# Patient Record
Sex: Male | Born: 1935 | Race: White | Hispanic: No | Marital: Married | Smoking: Former smoker
Health system: Southern US, Community
[De-identification: ages and names within clinical notes are randomized; demographics above are authoritative.]

## PROBLEM LIST (undated history)

## (undated) DIAGNOSIS — F0280 Dementia in other diseases classified elsewhere without behavioral disturbance: Secondary | ICD-10-CM

## (undated) DIAGNOSIS — I2510 Atherosclerotic heart disease of native coronary artery without angina pectoris: Secondary | ICD-10-CM

## (undated) DIAGNOSIS — F32A Depression, unspecified: Secondary | ICD-10-CM

## (undated) DIAGNOSIS — K6389 Other specified diseases of intestine: Secondary | ICD-10-CM

## (undated) DIAGNOSIS — I10 Essential (primary) hypertension: Secondary | ICD-10-CM

## (undated) DIAGNOSIS — N189 Chronic kidney disease, unspecified: Secondary | ICD-10-CM

## (undated) DIAGNOSIS — J209 Acute bronchitis, unspecified: Secondary | ICD-10-CM

## (undated) DIAGNOSIS — R4189 Other symptoms and signs involving cognitive functions and awareness: Secondary | ICD-10-CM

## (undated) DIAGNOSIS — J8410 Pulmonary fibrosis, unspecified: Secondary | ICD-10-CM

## (undated) DIAGNOSIS — F329 Major depressive disorder, single episode, unspecified: Secondary | ICD-10-CM

## (undated) DIAGNOSIS — G4730 Sleep apnea, unspecified: Secondary | ICD-10-CM

## (undated) DIAGNOSIS — N400 Enlarged prostate without lower urinary tract symptoms: Secondary | ICD-10-CM

## (undated) DIAGNOSIS — E785 Hyperlipidemia, unspecified: Secondary | ICD-10-CM

## (undated) DIAGNOSIS — J449 Chronic obstructive pulmonary disease, unspecified: Secondary | ICD-10-CM

## (undated) DIAGNOSIS — D649 Anemia, unspecified: Secondary | ICD-10-CM

## (undated) DIAGNOSIS — G309 Alzheimer's disease, unspecified: Secondary | ICD-10-CM

## (undated) HISTORY — DX: Other specified diseases of intestine: K63.89

## (undated) HISTORY — DX: Essential (primary) hypertension: I10

## (undated) HISTORY — DX: Chronic kidney disease, unspecified: N18.9

## (undated) HISTORY — DX: Benign prostatic hyperplasia without lower urinary tract symptoms: N40.0

## (undated) HISTORY — DX: Chronic obstructive pulmonary disease, unspecified: J44.9

## (undated) HISTORY — PX: TONSILLECTOMY: SUR1361

## (undated) HISTORY — PX: CYSTECTOMY: SUR359

## (undated) HISTORY — DX: Acute bronchitis, unspecified: J20.9

## (undated) HISTORY — DX: Hyperlipidemia, unspecified: E78.5

## (undated) HISTORY — DX: Depression, unspecified: F32.A

## (undated) HISTORY — DX: Atherosclerotic heart disease of native coronary artery without angina pectoris: I25.10

## (undated) HISTORY — DX: Sleep apnea, unspecified: G47.30

## (undated) HISTORY — PX: CORONARY ANGIOPLASTY: SHX604

## (undated) HISTORY — DX: Dementia in other diseases classified elsewhere, unspecified severity, without behavioral disturbance, psychotic disturbance, mood disturbance, and anxiety: F02.80

## (undated) HISTORY — DX: Alzheimer's disease, unspecified: G30.9

## (undated) HISTORY — DX: Anemia, unspecified: D64.9

## (undated) HISTORY — DX: Pulmonary fibrosis, unspecified: J84.10

## (undated) HISTORY — DX: Other symptoms and signs involving cognitive functions and awareness: R41.89

## (undated) HISTORY — DX: Major depressive disorder, single episode, unspecified: F32.9

## (undated) HISTORY — PX: PILONIDAL CYST EXCISION: SHX744

---

## 1993-05-24 ENCOUNTER — Encounter: Payer: Self-pay | Admitting: Cardiovascular Disease

## 2000-10-11 ENCOUNTER — Encounter: Payer: Self-pay | Admitting: Cardiovascular Disease

## 2007-09-03 ENCOUNTER — Ambulatory Visit: Payer: Self-pay | Admitting: Internal Medicine

## 2007-09-05 ENCOUNTER — Encounter: Payer: Self-pay | Admitting: Cardiovascular Disease

## 2008-06-14 ENCOUNTER — Ambulatory Visit: Payer: Self-pay | Admitting: Internal Medicine

## 2008-11-14 ENCOUNTER — Inpatient Hospital Stay: Payer: Self-pay | Admitting: Internal Medicine

## 2008-12-16 ENCOUNTER — Ambulatory Visit: Payer: Self-pay | Admitting: Internal Medicine

## 2009-01-28 ENCOUNTER — Ambulatory Visit: Payer: Self-pay | Admitting: Internal Medicine

## 2009-05-23 ENCOUNTER — Ambulatory Visit: Payer: Self-pay | Admitting: Internal Medicine

## 2009-05-30 ENCOUNTER — Encounter: Payer: Self-pay | Admitting: Cardiovascular Disease

## 2009-05-30 LAB — CONVERTED CEMR LAB
AST: 25 units/L
Basophils Relative: 0.7 %
CO2: 28.8 meq/L
Chloride: 110 meq/L
Cholesterol: 105 mg/dL
HCT: 37.8 %
Hemoglobin: 12.2 g/dL
Lymphocytes, automated: 23.8 %
Monocytes Relative: 8.6 %
Platelets: 190 10*3/uL
Potassium: 4.5 meq/L
Sodium: 144.7 meq/L
WBC: 7.7 10*3/uL

## 2009-06-12 ENCOUNTER — Emergency Department: Payer: BC Managed Care – PPO | Admitting: Internal Medicine

## 2009-07-21 ENCOUNTER — Encounter: Payer: Self-pay | Admitting: Cardiovascular Disease

## 2009-08-08 ENCOUNTER — Encounter: Payer: Self-pay | Admitting: Cardiovascular Disease

## 2009-08-11 ENCOUNTER — Ambulatory Visit: Payer: Self-pay | Admitting: Cardiovascular Disease

## 2009-08-11 DIAGNOSIS — I10 Essential (primary) hypertension: Secondary | ICD-10-CM | POA: Insufficient documentation

## 2009-08-11 DIAGNOSIS — J4489 Other specified chronic obstructive pulmonary disease: Secondary | ICD-10-CM | POA: Insufficient documentation

## 2009-08-11 DIAGNOSIS — J449 Chronic obstructive pulmonary disease, unspecified: Secondary | ICD-10-CM | POA: Insufficient documentation

## 2009-08-11 DIAGNOSIS — E782 Mixed hyperlipidemia: Secondary | ICD-10-CM | POA: Insufficient documentation

## 2009-08-11 DIAGNOSIS — I2510 Atherosclerotic heart disease of native coronary artery without angina pectoris: Secondary | ICD-10-CM | POA: Insufficient documentation

## 2009-08-12 ENCOUNTER — Encounter: Payer: Self-pay | Admitting: Cardiovascular Disease

## 2009-09-15 ENCOUNTER — Telehealth: Payer: Self-pay | Admitting: Cardiovascular Disease

## 2009-10-31 ENCOUNTER — Inpatient Hospital Stay: Payer: BC Managed Care – PPO | Admitting: Internal Medicine

## 2010-01-05 ENCOUNTER — Telehealth: Payer: Self-pay | Admitting: Adult Health

## 2010-01-10 ENCOUNTER — Ambulatory Visit: Payer: BC Managed Care – PPO | Admitting: Specialist

## 2010-01-13 ENCOUNTER — Ambulatory Visit: Payer: Self-pay | Admitting: Cardiovascular Disease

## 2010-01-13 DIAGNOSIS — R002 Palpitations: Secondary | ICD-10-CM

## 2010-04-24 ENCOUNTER — Encounter: Payer: Self-pay | Admitting: Cardiovascular Disease

## 2010-05-26 ENCOUNTER — Ambulatory Visit: Payer: Self-pay | Admitting: Cardiovascular Disease

## 2010-05-26 ENCOUNTER — Encounter: Payer: Self-pay | Admitting: Cardiovascular Disease

## 2010-05-26 DIAGNOSIS — I259 Chronic ischemic heart disease, unspecified: Secondary | ICD-10-CM

## 2010-05-30 ENCOUNTER — Ambulatory Visit: Payer: BC Managed Care – PPO | Admitting: Specialist

## 2010-06-06 ENCOUNTER — Telehealth (INDEPENDENT_AMBULATORY_CARE_PROVIDER_SITE_OTHER): Payer: Self-pay | Admitting: Radiology

## 2010-06-07 ENCOUNTER — Encounter: Payer: Self-pay | Admitting: Cardiology

## 2010-06-07 ENCOUNTER — Encounter (HOSPITAL_COMMUNITY)
Admission: RE | Admit: 2010-06-07 | Discharge: 2010-07-11 | Payer: Self-pay | Source: Home / Self Care | Attending: Cardiovascular Disease | Admitting: Cardiovascular Disease

## 2010-06-07 ENCOUNTER — Ambulatory Visit: Payer: Self-pay

## 2010-06-08 ENCOUNTER — Telehealth: Payer: Self-pay | Admitting: Cardiovascular Disease

## 2010-07-11 NOTE — Assessment & Plan Note (Signed)
Summary: NP6/AMD   Visit Type:  New Patient Referring Provider:  [name] Primary Provider:  Ahmed Prima, Montez Hageman MD  CC:  no cp, shortness of breath with stress, and some edema in ankles and feet-wears pressure socks some. Marland Kitchen  History of Present Illness: very pleasant 77 year old gentleman with history of coronary artery disease, angioplasty of proximal RCA lesion estimated at 90% in 1994, who presents to establish care. He had a negative stress test in March 2009 at [facility]. He was last seen by me in March 2010.  Overall, he states that he has been doing well. He does have some shortness of breath from underlying COPD. He did smoke though stopped 40 years ago. He uses home oxygen prn and states that he recently uses oxygen  to go and play golf. He states that it was cumbersome to have a tank though he felt better with less shortness of breath. He denies any significant chest pain but does have a little pressure and shortness of breath when he exerts himself too hard. He is uncertain if this is cardiac.  Preventive Screening-Counseling & Management  Alcohol-Tobacco     Alcohol drinks/day: 0     Smoking Status: quit  Caffeine-Diet-Exercise     Caffeine use/day: 1-2 cups     Does Patient Exercise: yes      Drug Use:  no.    Current Problems (verified): 1)  Mixed Hyperlipidemia  (ICD-272.2) 2)  Essential Hypertension  (ICD-401.9) 3)  COPD  (ICD-496) 4)  Coronary Atherosclerosis Native Coronary Artery  (ICD-414.01)  Current Medications (verified): 1)  Aspirin 81 Mg Tbec (Aspirin) .... Take One Tablet By Mouth Daily 2)  Metoprolol Succinate 50 Mg Xr24h-Tab (Metoprolol Succinate) .... Take One Tablet By Mouth Daily 3)  B Complex-B12  Tabs (B Complex Vitamins) .Marland Kitchen.. 1 By Mouth Once Daily 4)  Symbicort 80-4.5 Mcg/act Aero (Budesonide-Formoterol Fumarate) .... As Directed 5)  Crestor 40 Mg Tabs (Rosuvastatin Calcium) .... Take One Tablet By Mouth Daily.  Allergies  (verified): No Known Drug Allergies  Past History:  Past Medical History: CAD C O P D Hyperlipidemia Hypertension PCI-PTCA (S/P)  Past Surgical History: Tonsillectomy pienatal cyst  Family History: Father: deceased 31: old age Mother: decesaed 8: old age Family History of Diabetes: Sister  Social History: Retired  Married  Tobacco Use - Former.  Alcohol Use - no Regular Exercise - yes Drug Use - no Smoking Status:  quit Alcohol drinks/day:  0 Caffeine use/day:  1-2 cups Does Patient Exercise:  yes Drug Use:  no  Review of Systems       The patient complains of dyspnea on exertion.  The patient denies fatigue, malaise, fever, weight gain/loss, vision loss, decreased hearing, hoarseness, chest pain, palpitations, shortness of breath, prolonged cough, wheezing, sleep apnea, coughing up blood, abdominal pain, blood in stool, nausea, vomiting, diarrhea, heartburn, incontinence, blood in urine, muscle weakness, joint pain, leg swelling, rash, skin lesions, headache, fainting, dizziness, depression, anxiety, enlarged lymph nodes, easy bruising or bleeding, and environmental allergies.         Trace LE edema   Vital Signs:  Patient profile:   77 year old male Height:      67.5 inches Weight:      187.25 pounds Pulse rate:   65 / minute Pulse rhythm:   regular BP sitting:   120 / 62  (left arm) Cuff size:   regular  Vitals Entered By: Mercer Pod (August 11, 2009 10:33 AM)  Physical Exam  General:  well-appearing elderly gentleman in no apparent distress, alert and oriented x3, HEENT exam is benign, neck is supple with no JVP or carotid bruits, heart sounds are regular with normal S1-S2 and no murmurs appreciated, lungs are clear to auscultation with no wheezes or rales, abdominal exam is benign, trace lower extremity edema around the ankles, pulses are equal and symmetrical in his upper and lower extremities, skin is warm and dry.   EKG  Procedure date:   08/11/2009  Findings:      normal sinus rhythm with rate 65 beats per minute, no significant ST or T wave changes.  Impression & Recommendations:  Problem # 1:  CORONARY ATHEROSCLEROSIS NATIVE CORONARY ARTERY (ICD-414.01) history of balloon angioplasty in 1994 to the proximal RCA, negative stress test last in March 2009. This was a pharmacologic study. He is nervous about his coronary anatomy given his continued shortness of breath as this is now his disease presented back in 70. He would like to set up a stress test prior to his next visit in 6 months time. Have asked him to contact me if his shortness of breath gets worse before that time. We will continue him on his current medication regimen.  His updated medication list for this problem includes:    Aspirin 81 Mg Tbec (Aspirin) .Marland Kitchen... Take one tablet by mouth daily    Metoprolol Succinate 50 Mg Xr24h-tab (Metoprolol succinate) .Marland Kitchen... Take one tablet by mouth daily  Problem # 2:  MIXED HYPERLIPIDEMIA (ICD-272.2) We will try to obtain his most recent lipid panel from Dr. Graciela Husbands.   His updated medication list for this problem includes:    Crestor 40 Mg Tabs (Rosuvastatin calcium) .Marland Kitchen... Take one tablet by mouth daily.  Problem # 3:  COPD (ICD-496) Remote smoking history, he states having significantly reduced pulmonary function study which we do not have available to [place]. He feels better on low-dose oxygen with exertion.  His updated medication list for this problem includes:    Symbicort 80-4.5 Mcg/act Aero (Budesonide-formoterol fumarate) .Marland Kitchen... As directed  Patient Instructions: 1)  Your physician recommends that you schedule a follow-up appointment in: 6 months 2)  Your physician recommends that you continue on your current medications as directed. Please refer to the Current Medication list given to you today. 3)  Your physician has requested that you have an lexiscan myoview.  For further information please visit [email or web address]  We will you with appt.  In 5 months

## 2010-07-11 NOTE — Progress Notes (Signed)
Summary: PHI  PHI   Imported By: Harlon Flor 08/08/2009 10:19:04  _____________________________________________________________________  External Attachment:    Type:   Image     Comment:   External Document

## 2010-07-11 NOTE — Letter (Signed)
Summary: Medical Record Release  Medical Record Release   Imported By: Harlon Flor 08/08/2009 08:28:05  _____________________________________________________________________  External Attachment:    Type:   Image     Comment:   External Document

## 2010-07-11 NOTE — Progress Notes (Signed)
  Phone Note Call from Patient   Action Taken: Phone Call Completed Summary of Call: Bradley Holt called stating that his HR was irregular.  He states that "it goes along and then stops. Then it starts again."  He had complaints of being tired today. Took his BP and it was found to be 120's systolic.  He denies chest pain, dizziness, palpatation, or SOB.  I have advised him to rest and to come to ER should he experience any of the above symptoms.  He is supposed to see Dr. [name] in a couple of weeks.  I will leave a message for the office to call him in the morning to check on him and possibly do some lab work if it is necessary,. Initial call taken by: Joni Reining NP     Appended Document:  pt called stated that he was no longer having difficulty. Vitals this am 128/83-71.  Pt normaly wears o2 and went out yesterday and was without o2 7-8 hours. Rescheduled his sept appointment for aug 5th.

## 2010-07-11 NOTE — Op Note (Signed)
Summary: Angioplasty  Angioplasty   Imported By: Harlon Flor 08/12/2009 15:57:17  _____________________________________________________________________  External Attachment:    Type:   Image     Comment:   External Document

## 2010-07-11 NOTE — Progress Notes (Signed)
Summary: Refill on Crestor  Phone Note Refill Request Message from:  Patient on September 15, 2009 9:30 AM  Refills Requested: Medication #1:  CRESTOR 40 MG TABS Take one tablet by mouth daily.. [facility]     Prescriptions: CRESTOR 40 MG TABS (ROSUVASTATIN CALCIUM) Take one tablet by mouth daily.  #30 x 6   Entered by:   Mercer Pod   Authorized by:   Dossie Arbour MD   Signed by:   Mercer Pod on 09/15/2009   Method used:   [facility] to        [id number]       [address]       [place], [place]  [address]       Ph: [phone]       Fax: [phone]   RxID:   [id number]

## 2010-07-11 NOTE — Assessment & Plan Note (Signed)
Summary: F6M/AMD   Visit Type:  Follow-up Referring Provider:  [name] Primary Provider:  Ahmed Prima, Montez Hageman MD   History of Present Illness: very pleasant 77 year old gentleman with history of coronary artery disease, angioplasty of proximal RCA lesion estimated at 90% in 1994,   negative stress test in March 2009 at [facility] who presents for routine followup.  he has severe COPD and uses oxygen p.r.n. He has a remote smoking history. He is getting over a pneumonia, also had anemia. This has made him weak and he is still recovering. He is not on iron but reports he was on iron previously.  He has been having some skipping of his heart beat that seem to come and go Overall, he states that he has been doing well. he does take inhalers and feels his shortness of breath is stable. No significant chest pain. he does not exercise but likes to play golf but has not been playing recently as he is feeling weak.  EKG shows normal sinus rhythm with rate 75 beats per minute, no significant ST or T wave changes.  cholesterol has been at goal, in fact has been a little bit too low with LDL of 40.  Current Medications (verified): 1)  Aspirin 81 Mg Tbec (Aspirin) .... Take One Tablet By Mouth Daily 2)  Metoprolol Succinate 25 Mg Xr24h-Tab (Metoprolol Succinate) .Marland Kitchen.. 1 Once Daily 3)  Symbicort 80-4.5 Mcg/act Aero (Budesonide-Formoterol Fumarate) .... As Directed 4)  Crestor 40 Mg Tabs (Rosuvastatin Calcium) .... Take One Tablet By Mouth Daily. 5)  Combivent 18-103 Mcg/act Aero (Ipratropium-Albuterol) .... Three Times A Day 6)  Zyrtec Allergy 10 Mg Tbdp (Cetirizine Hcl) .... As Needed 7)  O2 .... 2 Liters Daily  Allergies (verified): No Known Drug Allergies  Past History:  Past Medical History: Last updated: 08/25/2009 CAD C O P D Hyperlipidemia Hypertension PCI-PTCA (S/P) Sleep apnea BPH Chronic anemia Pulmonary fibrosis  Past Surgical History: Last updated:  08-15-09 Tonsillectomy pienatal cyst  Family History: Last updated: 2009-08-15 Father: deceased 54: old age Mother: decesaed 61: old age Family History of Diabetes: Sister  Social History: Last updated: 08/15/2009 Retired  Married  Tobacco Use - Former.  Alcohol Use - no Regular Exercise - yes Drug Use - no  Risk Factors: Alcohol Use: 0 (2009/08/15) Caffeine Use: 1-2 cups (August 15, 2009) Exercise: yes (08/15/2009)  Risk Factors: Smoking Status: quit (August 15, 2009)  Review of Systems       The patient complains of dyspnea on exertion.  The patient denies fever, weight loss, weight gain, vision loss, decreased hearing, hoarseness, chest pain, syncope, peripheral edema, prolonged cough, abdominal pain, incontinence, muscle weakness, depression, and enlarged lymph nodes.         Little Weak  Vital Signs:  Patient profile:   76 year old male Height:      67.5 inches Weight:      188 pounds BMI:     29.12 Pulse rate:   66 / minute Pulse rhythm:   regular BP sitting:   130 / 64  (left arm) Cuff size:   regular  Vitals Entered By: Bishop Dublin, CMA (January 13, 2010 3:44 PM)  Physical Exam  General:  well-appearing elderly gentleman in no apparent distress, alert and oriented x3, HEENT exam is benign, neck is supple with no JVP or carotid bruits, heart sounds are regular with normal S1-S2 and no murmurs appreciated, lungs are clear to auscultation with no wheezes or rales, abdominal exam is benign, trace  lower extremity edema around the ankles, pulses are equal and symmetrical in his upper and lower extremities, skin is warm and dry.   Impression & Recommendations:  Problem # 1:  CORONARY ATHEROSCLEROSIS NATIVE CORONARY ARTERY (ICD-414.01) no symptoms concerning for angina. We have encouraged him to increase his exercise  His updated medication list for this problem includes:    Aspirin 81 Mg Tbec (Aspirin) .Marland Kitchen... Take one tablet by mouth daily    Metoprolol Succinate  25 Mg Xr24h-tab (Metoprolol succinate) .Marland Kitchen... 1 once daily  Orders: EKG w/ Interpretation (93000)  Problem # 2:  MIXED HYPERLIPIDEMIA (ICD-272.2) His LDL is very low. We have suggested he could cut his Crestor and half to 20 mg daily. If he continues to have a very low LDL and total cholesterol, we could continue to decrease his Crestor dose.  His updated medication list for this problem includes:    Crestor 40 Mg Tabs (Rosuvastatin calcium) .Marland Kitchen... Take 1/2  tablet by mouth daily.  Problem # 3:  ESSENTIAL HYPERTENSION (ICD-401.9) Blood pressure is relatively well controlled on his current medications. No changes made.  His updated medication list for this problem includes:    Aspirin 81 Mg Tbec (Aspirin) .Marland Kitchen... Take one tablet by mouth daily    Metoprolol Succinate 25 Mg Xr24h-tab (Metoprolol succinate) .Marland Kitchen... 1 once daily  Problem # 4:  PALPITATIONS (ICD-785.1) His patients are likely due to APCs or VPCs. There are rare. He does not want a Holter monitor at this time.  His updated medication list for this problem includes:    Aspirin 81 Mg Tbec (Aspirin) .Marland Kitchen... Take one tablet by mouth daily    Metoprolol Succinate 25 Mg Xr24h-tab (Metoprolol succinate) .Marland Kitchen... 1 once daily

## 2010-07-11 NOTE — Cardiovascular Report (Signed)
Summary: Cardiac Cath Other  Cardiac Cath Other   Imported By: Harlon Flor 08/12/2009 15:57:42  _____________________________________________________________________  External Attachment:    Type:   Image     Comment:   External Document

## 2010-07-13 NOTE — Progress Notes (Signed)
Summary: Nuc Pre-Procedure  Phone Note Call from Patient   Caller: Patient Action Taken: Phone Call Completed Summary of Call: Reviewed information on Myoview Information Sheet (see scanned document for further details).  Spoke with patient. Initial call taken by: Leonia Corona, RT-N,  June 06, 2010 4:40 PM     Nuclear Med Background Indications for Stress Test: Evaluation for Ischemia, PTCA Patency   History: Angioplasty, COPD, Myocardial Perfusion Study  History Comments: 61- PTCA-RCA 2009- Nl. MPS  Symptoms: Palpitations, SOB  Symptoms Comments: Recent Tachypalpitations   Nuclear Pre-Procedure Cardiac Risk Factors: History of Smoking, Hypertension, Lipids Height (in): 67.5

## 2010-07-13 NOTE — Progress Notes (Signed)
Summary: Palpitations  Phone Note Outgoing Call   Call placed by: Lanny Hurst RN,  June 08, 2010 4:34 PM Summary of Call: Called pt to f/u about palpitations. Pt states he had palpitations this am, and they have not changed as previously described. They are occasional and occur at random times of day, pt denies any notable symptoms along with palpitations. Pt states if Dr. [name] would like him to wear a monitor he would like to wait until January 2012. Initial call taken by: Lanny Hurst RN,  June 08, 2010 4:37 PM

## 2010-07-13 NOTE — Assessment & Plan Note (Signed)
Summary: HEART FLUTTERS/NOT FEELING WELL/SAB   Visit Type:  Follow-up Referring [name]:  [name] Primary [name]:  Ahmed Prima, Montez Hageman MD  CC:  c/o fast pulse and palpitations last night. Last about a hour per pt. Denies chest pain and SOB.Bradley Holt  History of Present Illness: Very pleasant 77 year old gentleman with history of coronary artery disease, angioplasty of proximal RCA lesion estimated at 90% in 1994,   negative stress test in March 2009 at [facility] who presents for followup after developing symptoms of tachycardia palpitations and mild shortness of breath last night lasting more than one hour.  he has severe COPD and uses oxygen p.r.n. He has a remote smoking history. he had been doing well and played some golf yesterday. In the evening, he developed acute onset of palpitations and felt a rapid heart rate. He went to bed. He noted some mild shortness of breath. His wife appreciated the abnormal rhythm. The rhythm went away by the time he woke up in the morning. Today feels well though is very nervous about what happened last night and reports that the same thing happened prior to his previous angioplasty.  EKG shows normal sinus rhythm with rate 84 beats per minute, no significant ST or T wave changes.  cholesterol has been at goal, in fact has been a little bit too low  Current Medications (verified): 1)  Aspirin 81 Mg Tbec (Aspirin) .... Take One Tablet By Mouth Daily 2)  Metoprolol Succinate 25 Mg Xr24h-Tab (Metoprolol Succinate) .Bradley Holt.. 1 Once Daily 3)  Symbicort 80-4.5 Mcg/act Aero (Budesonide-Formoterol Fumarate) .... Two Times A Day As Directed 4)  Crestor 20 Mg Tabs (Rosuvastatin Calcium) .Bradley Holt.. 1 Tablet Daily 5)  Zyrtec Allergy 10 Mg Tbdp (Cetirizine Hcl) .... As Needed 6)  O2 .... 2 Liters Daily 7)  Losartan Potassium 50 Mg Tabs (Losartan Potassium) .Bradley Holt.. 1 Tablet Daily 8)  Spiriva Handihaler 18 Mcg Caps (Tiotropium Bromide Monohydrate) .... As  Directed 9)  Ventolin Hfa 108 (90 Base) Mcg/act Aers (Albuterol Sulfate) .... 3 Times Daily  Allergies (verified): No Known Drug Allergies  Past History:  Past Medical History: Last updated: 08/25/2009 CAD C O P D Hyperlipidemia Hypertension PCI-PTCA (S/P) Sleep apnea BPH Chronic anemia Pulmonary fibrosis  Past Surgical History: Last updated: August 29, 2009 Tonsillectomy pienatal cyst  Family History: Last updated: August 29, 2009 Father: deceased 21: old age Mother: decesaed 47: old age Family History of Diabetes: Sister  Social History: Last updated: 2009-08-29 Retired  Married  Tobacco Use - Former.  Alcohol Use - no Regular Exercise - yes Drug Use - no  Risk Factors: Alcohol Use: 0 (29-Aug-2009) Caffeine Use: 1-2 cups (08-29-2009) Exercise: yes (2009/08/29)  Risk Factors: Smoking Status: quit (29-Aug-2009)  Review of Systems       The patient complains of dyspnea on exertion.  The patient denies fever, weight loss, weight gain, vision loss, decreased hearing, hoarseness, chest pain, syncope, peripheral edema, prolonged cough, abdominal pain, incontinence, muscle weakness, depression, and enlarged lymph nodes.         tachycardia/palpitations  Vital Signs:  Patient profile:   77 year old male Height:      67.5 inches Weight:      183.25 pounds BMI:     28.38 Pulse rate:   84 / minute BP sitting:   134 / 68  (left arm) Cuff size:   regular  Vitals Entered By: Lysbeth Galas CMA (May 26, 2010 3:12 PM)  Physical Exam  General:  well-appearing elderly gentleman in no  apparent distress, alert and oriented x3, HEENT exam is benign, neck is supple with no JVP or carotid bruits, heart sounds are regular with normal S1-S2 and no murmurs appreciated, lungs are clear to auscultation with no wheezes or rales, abdominal exam is benign, trace lower extremity edema around the ankles, pulses are equal and symmetrical in his upper and lower extremities, skin is warm and  dry. Psych:  Normal affect.   Impression & Recommendations:  Problem # 1:  CORONARY ARTERY DISEASE, S/P PTCA (ICD-414.9) history of coronary artery disease. I'm concerned that his recent episode of tachycardia palpitations last night could be his anginal equivalent as this happened prior to him receiving angioplasty many years ago with similar symptoms. He has worsening shortness of breath and needs oxygen now on a chronic basis which is worse than before when he had a stress test 2 years ago. We have ordered a [place] scan to be done before the end of the year. Given his tachypalpitations, we will increase his metoprolol succinate  to 37.5 mg daily and have suggested he take an additional 12.5 mg p.r.n.  His updated medication list for this problem includes:    Aspirin 81 Mg Tbec (Aspirin) .Bradley Holt... Take one tablet by mouth daily    Metoprolol Succinate 25 Mg Xr24h-tab (Metoprolol succinate) .Bradley Holt... Take 1 tablet two times a day.  Orders: Nuclear Stress Test (Nuc Stress Test)  Problem # 2:  COPD (ICD-496) Severe underlying COPD though his lungs seemed to have good air movement throughout. He reports that Dr. Meredeth Ide has a CT scan of his chest ordered.  The following medications were removed from the medication list:    Combivent 18-103 Mcg/act Aero (Ipratropium-albuterol) .Bradley Holt... Three times a day His updated medication list for this problem includes:    Symbicort 80-4.5 Mcg/act Aero (Budesonide-formoterol fumarate) .Bradley Holt..Bradley Holt Two times a day as directed    Spiriva Handihaler 18 Mcg Caps (Tiotropium bromide monohydrate) .Bradley Holt... As directed    Ventolin Hfa 108 (90 Base) Mcg/act Aers (Albuterol sulfate) .Bradley KitchenMarland KitchenMarland KitchenMarland Holt 3 times daily  Problem # 3:  ESSENTIAL HYPERTENSION (ICD-401.9) Blood pressure appears to be well controlled on his current medication regimen and. We will increase his metoprolol as detailed.  His updated medication list for this problem includes:    Aspirin 81 Mg Tbec (Aspirin) .Bradley Holt... Take  one tablet by mouth daily    Metoprolol Succinate 25 Mg Xr24h-tab (Metoprolol succinate) .Bradley Holt... Take 1 tablet two times a day.    Losartan Potassium 50 Mg Tabs (Losartan potassium) .Bradley Holt... 1 tablet daily  Problem # 4:  MIXED HYPERLIPIDEMIA (ICD-272.2) he is at goal for LDL less than 70. We'll try to obtain his most recent lipids from Dr. Daniel Nones.  His updated medication list for this problem includes:    Crestor 20 Mg Tabs (Rosuvastatin calcium) .Bradley Holt... 1 tablet daily  Patient Instructions: 1)  Your physician recommends that you schedule a follow-up appointment in: 6 months 2)  Your physician has recommended you make the following change in your medication: INCREASE METOPROLOL 25MG  1 1/2 TABLETS ONCE DAILY 3)  Your physician has requested that you have a Lexiscan myoview.  For further information please visit [email or web address]  Please follow instruction sheet, as given. Prescriptions: METOPROLOL SUCCINATE 25 MG XR24H-TAB (METOPROLOL SUCCINATE) Take 1 tablet two times a day.  #60 x 6   Entered by:   Lanny Hurst RN   Authorized by:   Dossie Arbour MD   Signed by:   Lanny Hurst RN on 05/26/2010  Method used:   [id number]       [address]       [place], [place]  [address]       Ph: [phone]       Fax: [phone]   RxID:   [id number]

## 2010-07-13 NOTE — Assessment & Plan Note (Signed)
Summary: Cardiology Nuclear Testing  Nuclear Med Background Indications for Stress Test: Evaluation for Ischemia, PTCA Patency   History: Angioplasty, COPD, Emphysema, Heart Catheterization, Myocardial Perfusion Study  History Comments: 1994-Cath> PTCA-RCA 2009- NL. MPS Hx. Pulmonary Fibrosis.  Symptoms: DOE, Fatigue, Fatigue with Exertion, Light-Headedness, Palpitations, Rapid HR, SOB  Symptoms Comments: Recent Tachypalpitations   Nuclear Pre-Procedure Cardiac Risk Factors: History of Smoking, Hypertension, Lipids Caffeine/Decaff Intake: none NPO After: 6:00 PM Lungs: Clear IV 0.9% NS with Angio Cath: 22g     IV Site: R Hand IV Started by: Cathlyn Parsons, RN Chest Size (in) 42     Height (in): 67.5 Weight (lb): 182 BMI: 28.19 Tech Comments: Metoprolol held x 36hrs. Patient used ventolin inhaler this am at 6:30.  Nuclear Med Study 1 or 2 day study:  1 day     Stress Test Type:  Lexiscan Reading MD:  Olga Millers, MD     Referring MD:  Julien Nordmann Resting Radionuclide:  Technetium 45m Tetrofosmin     Resting Radionuclide Dose:  11.0 mCi  Stress Radionuclide:  Technetium 75m Tetrofosmin     Stress Radionuclide Dose:  33.0 mCi   Stress Protocol      Max HR:  94 bpm Max Systolic BP: 156 mm HgRate Pressure Product:  98119  Lexiscan: 0.4 mg   Stress Test Technologist:  Irean Hong,  RN     Nuclear Technologist:  Doyne Keel, CNMT  Rest Procedure  Myocardial perfusion imaging was performed at rest 45 minutes following the intravenous administration of Technetium 41m Tetrofosmin.  Stress Procedure  The patient received IV Lexiscan 0.4 mg over 15-seconds.  Technetium 1m Tetrofosmin injected at 30-seconds.  There were no significant changes with lexiscan, rare PJC.  Quantitative spect images were obtained after a 45 minute delay.  QPS Raw Data Images:  Acquisition technically good; normal left ventricular size. Stress Images:  There is decreased uptake in the  apex. Rest Images:  There is decreased uptake in the apex. Subtraction (SDS):  No evidence of ischemia. Transient Ischemic Dilatation:  .96  (Normal <1.22)  Lung/Heart Ratio:  .29  (Normal <0.45)  Quantitative Gated Spect Images QGS EDV:  76 ml QGS ESV:  27 ml QGS EF:  64 % QGS cine images:  Normal wall motion.   Overall Impression  Exercise Capacity: Lexiscan with no exercise. BP Response: Normal blood pressure response. Clinical Symptoms: There is chest pain ECG Impression: No significant ST segment change suggestive of ischemia. Overall Impression: Normal lexiscan nuclear study with apical thinning but no ischemia.  Appended Document: Cardiology Nuclear Testing Normal stress test  Appended Document: Cardiology Nuclear Testing pt's wife notified /MES

## 2010-07-15 IMAGING — CT CT CHEST W/ CM
1 of 2 series · 14 of 32 positions shown, 18 images · IV contrast (APPLIED)
Comparison: none

REASON FOR EXAM: sob
COMMENTS:   May transport without cardiac monitor

PROCEDURE:     CT  - CT CHEST WITH CONTRAST  - June 12, 2009 [DATE]
RESULT:     Chest CT dated 06/12/2009
TECHNIQUE: Helical 3 mm sections were obtained from the thoracic inlet
through the lung bases status post intravenous administration of 85 mL
Isovue 300.
This study was compared to previous study dated 12/16/2008.

[Series 5: lung windows · axial · 0.76mm/px · z∈[-370,-106]mm · 14 of 106 slices shown, 18 images]
[im 9/106  mediastinal]
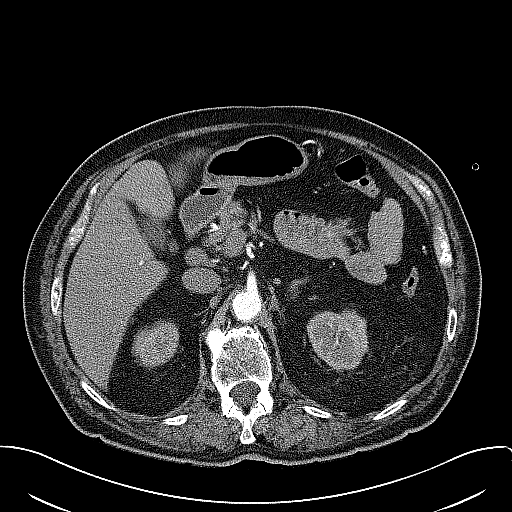
[im 9/106  lung]
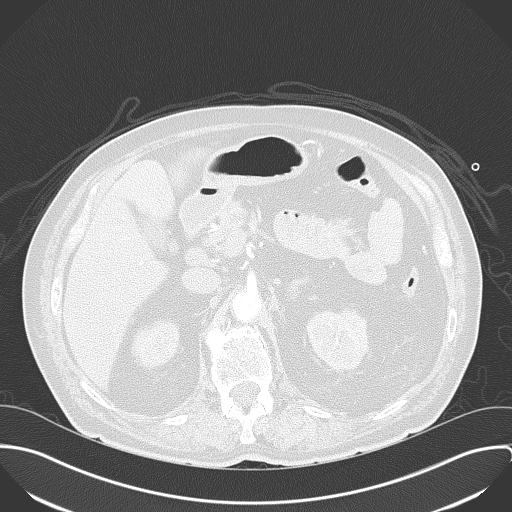
[im 17/106  lung]
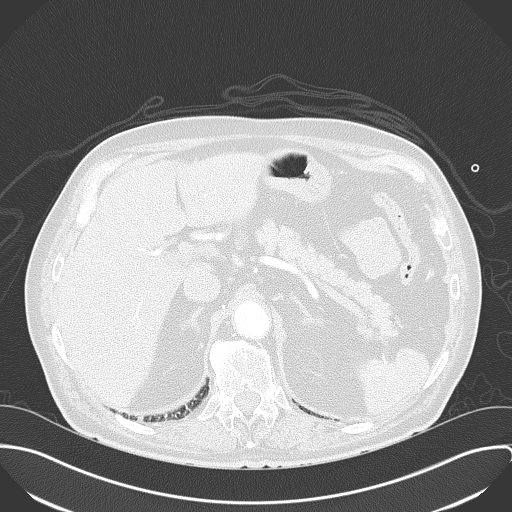
[im 25/106  lung]
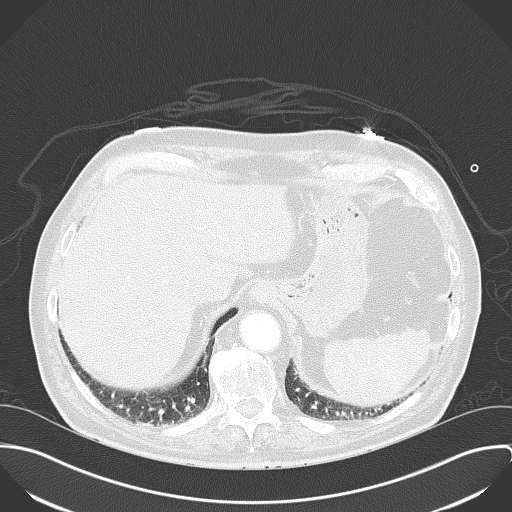
[im 33/106  lung]
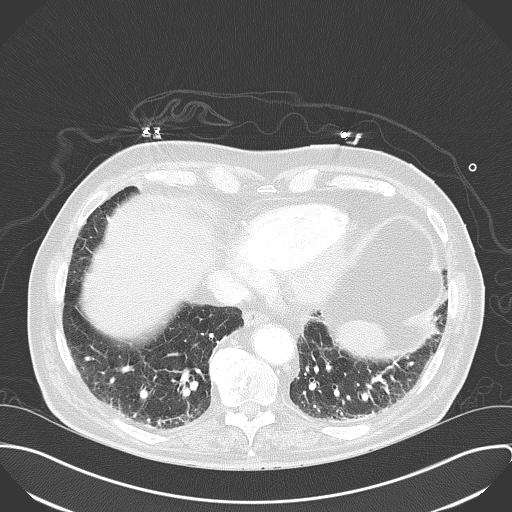
[im 41/106  mediastinal]
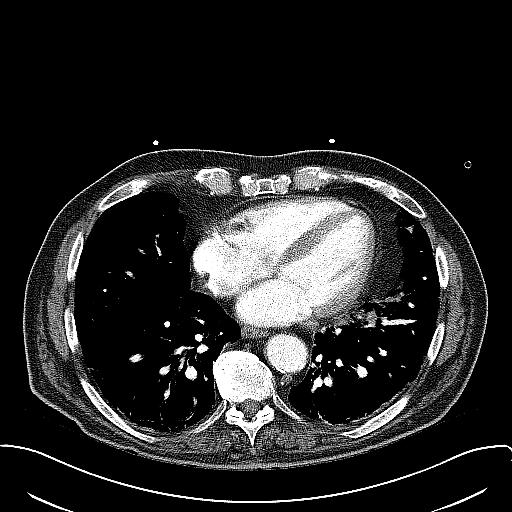
[im 41/106  lung]
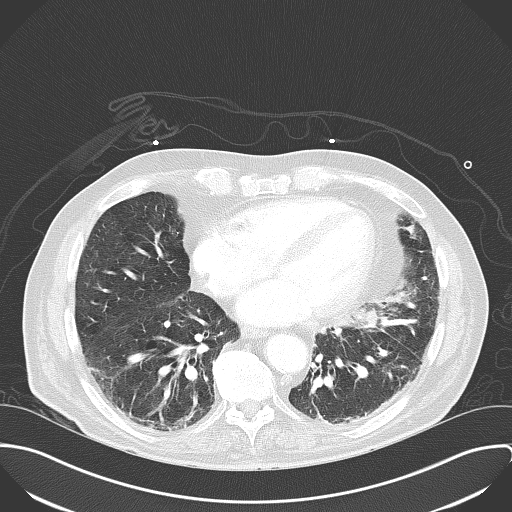
[im 49/106  lung]
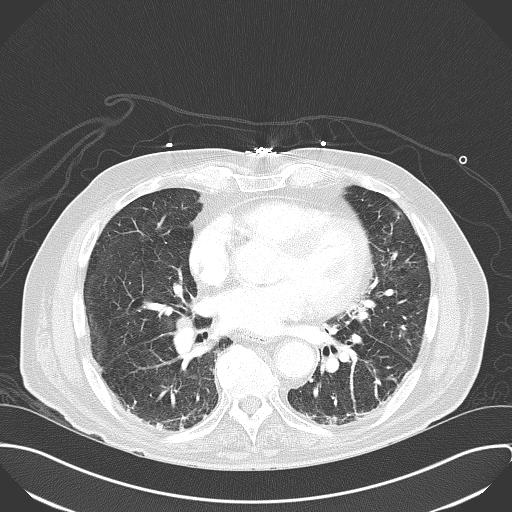
[im 50/106  lung]
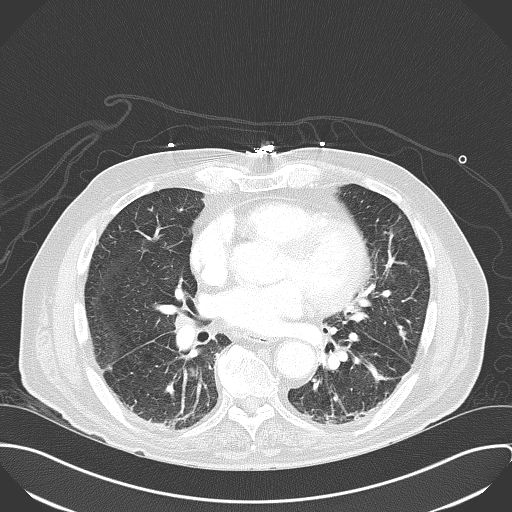
[im 53/106  lung]
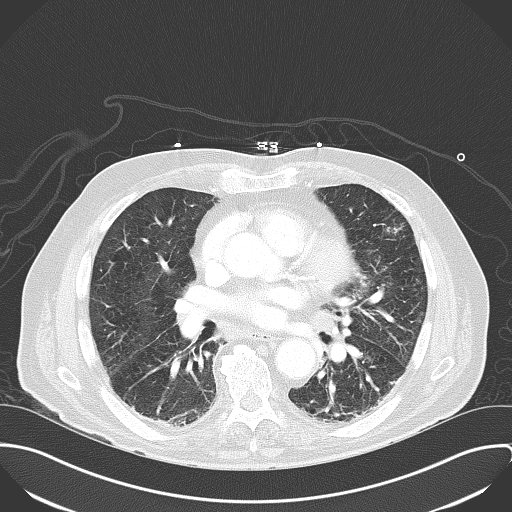
[im 57/106  mediastinal]
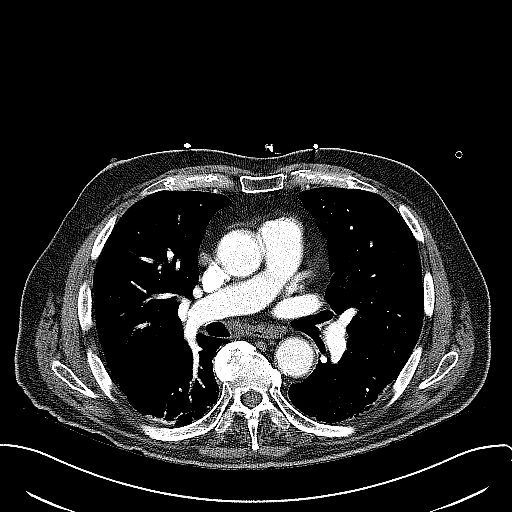
[im 57/106  lung]
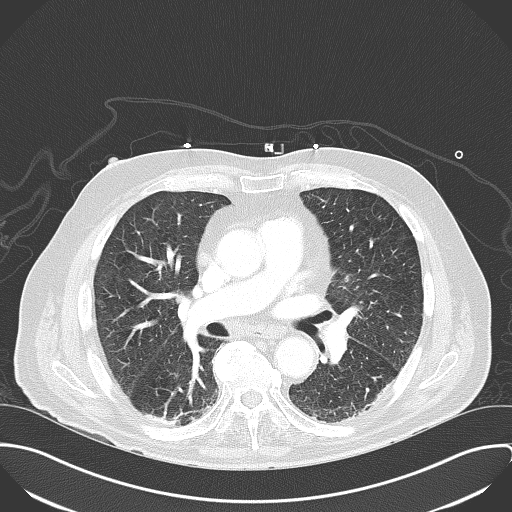
[im 65/106  lung]
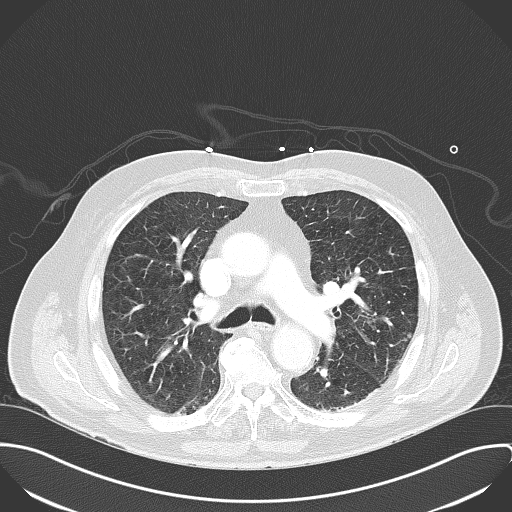
[im 73/106  lung]
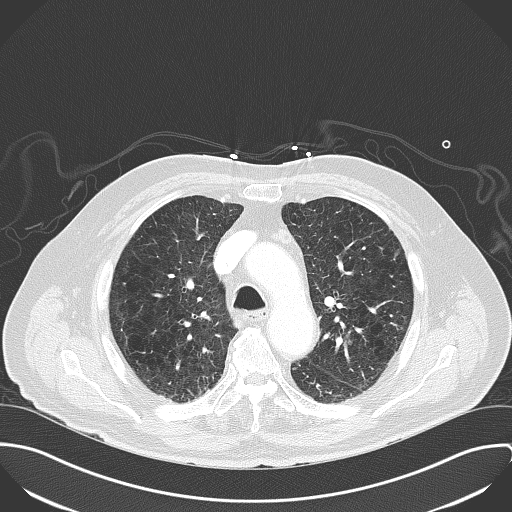
[im 81/106  lung]
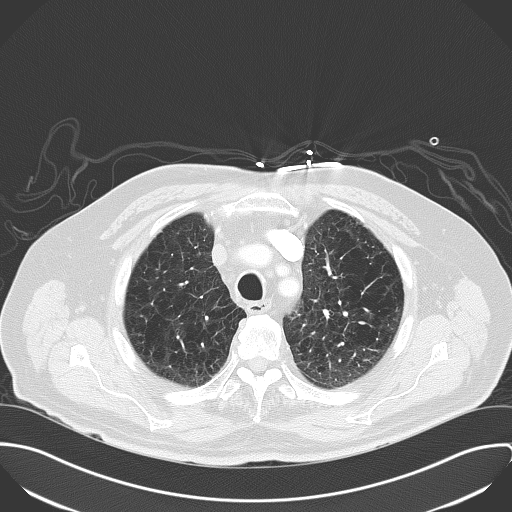
[im 89/106  mediastinal]
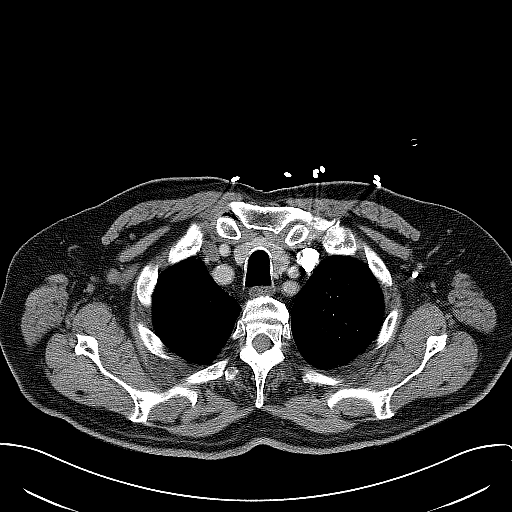
[im 89/106  lung]
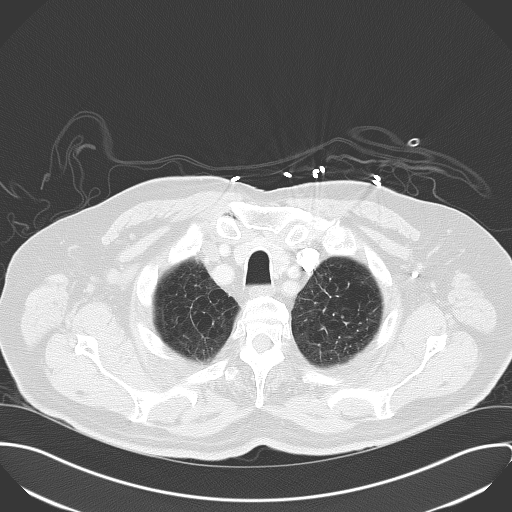
[im 97/106  lung]
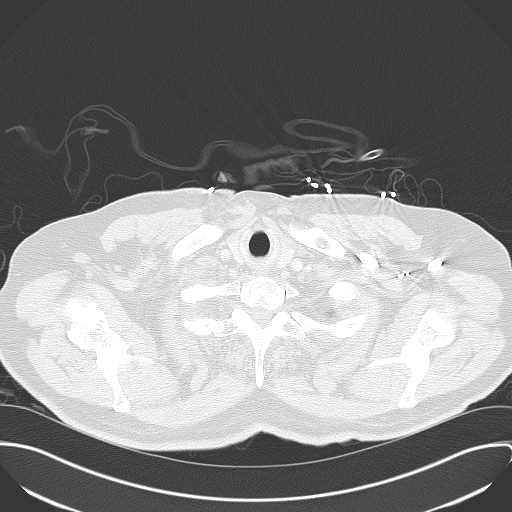

[14 of 32 positions shown; findings below may reference images not displayed]

FINDINGS: Evaluation of the mediastinum hilar regions and structures
demonstrates small subcentimeter lymph nodes within the prevascular space,
paratracheal region and subcarinal regions. A right pericardial lymph node
is also identified. There is no evidence of filling defects within the main
lobar or segmental pulmonary arteries. The lung parenchyma demonstrates
hypoventilation with an the lung bases as well as interstitial changes
within the lung bases. Emphysematous changes identified within the upper
lobe regions. No evidence of masses nor nodules identified. There is
bronchiectasis within the lung bases. The visualized upper abdominal viscera
demonstrates a fat containing nodule involving the left adrenal measuring
approximately 1 cm in diameter. The remaining visualized upper abdominal
viscera demonstrate no further gross abnormalities.
IMPRESSION: 1. No CT evidence of pulmonary arterial embolic disease
2. Interstitial fibrosis changes within the lung bases as well as
emphysematous changes within the lung apices.

3. Small possibly reactive mediastinal lymph nodes
4. Myolipoma versus a simple lipoma involving the left adrenal, this finding
is stable.

## 2010-12-03 ENCOUNTER — Observation Stay: Payer: BC Managed Care – PPO | Admitting: Internal Medicine

## 2010-12-04 DIAGNOSIS — R079 Chest pain, unspecified: Secondary | ICD-10-CM

## 2010-12-05 ENCOUNTER — Other Ambulatory Visit: Payer: Self-pay | Admitting: Emergency Medicine

## 2010-12-05 DIAGNOSIS — R079 Chest pain, unspecified: Secondary | ICD-10-CM

## 2010-12-05 MED ORDER — NITROGLYCERIN 0.4 MG SL SUBL: 0.4000 mg | SUBLINGUAL_TABLET | SUBLINGUAL | Status: DC | PRN

## 2010-12-19 ENCOUNTER — Encounter: Payer: Medicare Other | Admitting: Cardiovascular Disease

## 2010-12-20 ENCOUNTER — Encounter: Payer: Self-pay | Admitting: Cardiovascular Disease

## 2010-12-21 ENCOUNTER — Encounter: Payer: Medicare Other | Admitting: Cardiovascular Disease

## 2010-12-22 ENCOUNTER — Ambulatory Visit (INDEPENDENT_AMBULATORY_CARE_PROVIDER_SITE_OTHER): Payer: Medicare Other | Admitting: Cardiovascular Disease

## 2010-12-22 ENCOUNTER — Encounter: Payer: Self-pay | Admitting: Cardiovascular Disease

## 2010-12-22 ENCOUNTER — Ambulatory Visit: Payer: BC Managed Care – PPO | Admitting: Internal Medicine

## 2010-12-22 DIAGNOSIS — I10 Essential (primary) hypertension: Secondary | ICD-10-CM

## 2010-12-22 DIAGNOSIS — I2510 Atherosclerotic heart disease of native coronary artery without angina pectoris: Secondary | ICD-10-CM

## 2010-12-22 DIAGNOSIS — J449 Chronic obstructive pulmonary disease, unspecified: Secondary | ICD-10-CM

## 2010-12-22 DIAGNOSIS — I259 Chronic ischemic heart disease, unspecified: Secondary | ICD-10-CM

## 2010-12-22 DIAGNOSIS — J4489 Other specified chronic obstructive pulmonary disease: Secondary | ICD-10-CM

## 2010-12-22 DIAGNOSIS — E782 Mixed hyperlipidemia: Secondary | ICD-10-CM

## 2010-12-22 DIAGNOSIS — R079 Chest pain, unspecified: Secondary | ICD-10-CM

## 2010-12-22 NOTE — Patient Instructions (Signed)
You are doing well. No medication changes were made. Please call us if you have new issues that need to be addressed before your next appt.  We will call you for a follow up Appt. In 6 months  

## 2010-12-22 NOTE — Progress Notes (Signed)
Patient ID: Bradley Holt, male    DOB: 07-Apr-1920, 77 y.o.   MRN: [id number]  HPI Comments: 77 year old gentleman with history of coronary artery disease, angioplasty of proximal RCA lesion estimated at 90% in 1994,   negative stress test in March 2009 at [facility], Previous episodes of tachy palpitations and mild shortness of breath , Recent admission to [facility] for chest pain after arguing with his wife, with negative stress test (lexiscan Myoview), who presents for routine followup. H/o  severe COPD,  remote smoking history.   He reports that since he has left the hospital, he has been feeling well. He is still weak and has not gone back to playing golf. He does complain about his memory. His wife reports that on the way to the office today, he could not remember where the office was.  He has been off his Crestor for one week to make sure that this is not contributing to his symptoms. He continues on oxygen, currently on at least 3 L. Otherwise he feels well with no complaints here no further chest pain.   EKG shows normal sinus rhythm with rate 65 beats per minute, no significant ST or T wave changes.   cholesterol has been at goal      Outpatient Encounter Prescriptions as of 12/22/2010  Medication Sig Dispense Refill  . albuterol (VENTOLIN HFA) 108 (90 BASE) MCG/ACT inhaler Inhale 2 puffs into the lungs every 6 (six) hours as needed.        Marland Kitchen aspirin 81 MG EC tablet Take 81 mg by mouth daily.        . budesonide-formoterol (SYMBICORT) 80-4.5 MCG/ACT inhaler Inhale 2 puffs into the lungs 2 (two) times daily.        . cetirizine (ZYRTEC) 10 MG tablet Take 10 mg by mouth as needed.        Marland Kitchen losartan (COZAAR) 50 MG tablet Take 50 mg by mouth daily.        . metoprolol succinate (TOPROL-XL) 25 MG 24 hr tablet Take 1.5 tablets by mouth daily.      . nitroGLYCERIN (NITROSTAT) 0.4 MG SL tablet Place 1 tablet (0.4 mg total) under the tongue every 5 (five) minutes as  needed.  25 tablet  3  . NON FORMULARY O2. 2 liters daily.       Marland Kitchen tiotropium (SPIRIVA) 18 MCG inhalation capsule Place 18 mcg into inhaler and inhale as directed.        . rosuvastatin (CRESTOR) 40 MG tablet Take 40 mg by mouth daily.           Review of Systems  HENT: Negative.   Eyes: Negative.   Respiratory: Positive for shortness of breath.   Cardiovascular: Negative.   Gastrointestinal: Negative.   Musculoskeletal: Negative.   Skin: Negative.   Neurological: Positive for weakness.  Hematological: Negative.   Psychiatric/Behavioral: Negative.   All other systems reviewed and are negative.    BP 144/72  Pulse 65  Ht 5\' 8"  (1.727 m)  Wt 179 lb (81.194 kg)  BMI 27.22 kg/m2   Physical Exam  Nursing note and vitals reviewed. Constitutional: He is oriented to person, place, and time. He appears well-developed and well-nourished.       On oxygen 3.5 L nasal cannula.  HENT:  Head: Normocephalic.  Nose: Nose normal.  Mouth/Throat: Oropharynx is clear and moist.  Eyes: Conjunctivae are normal. Pupils are equal, round, and reactive to light.  Neck: Normal range of  motion. Neck supple. No JVD present.  Cardiovascular: Normal rate, regular rhythm, S1 normal, S2 normal, normal heart sounds and intact distal pulses.  Exam reveals no gallop and no friction rub.   No murmur heard. Pulmonary/Chest: Effort normal and breath sounds normal. No respiratory distress. He has no wheezes. He has no rales. He exhibits no tenderness.  Abdominal: Soft. Bowel sounds are normal. He exhibits no distension. There is no tenderness.  Musculoskeletal: Normal range of motion. He exhibits no edema and no tenderness.  Lymphadenopathy:    He has no cervical adenopathy.  Neurological: He is alert and oriented to person, place, and time. Coordination normal.  Skin: Skin is warm and dry. No rash noted. No erythema.  Psychiatric: He has a normal mood and affect. His behavior is normal. Judgment and thought  content normal.           Assessment and Plan

## 2010-12-23 DIAGNOSIS — R079 Chest pain, unspecified: Secondary | ICD-10-CM | POA: Insufficient documentation

## 2010-12-23 NOTE — Assessment & Plan Note (Signed)
Cholesterol is at goal on the current lipid regimen. No changes to the medications were made.  

## 2010-12-23 NOTE — Assessment & Plan Note (Signed)
Blood pressure is well controlled on today's visit. No changes made to the medications. 

## 2010-12-23 NOTE — Assessment & Plan Note (Signed)
Currently with no symptoms of angina. No further workup at this time. Continue current medication regimen. 

## 2010-12-23 NOTE — Assessment & Plan Note (Signed)
Recent evaluation at [facility] for chest pain with negative stress test. No further episodes of chest pain. Episode happened in the setting of an argument with his wife.

## 2010-12-23 NOTE — Assessment & Plan Note (Signed)
Stable respiratory status on oxygen nasal cannula.

## 2011-01-03 ENCOUNTER — Encounter: Payer: Self-pay | Admitting: Cardiovascular Disease

## 2011-01-15 ENCOUNTER — Emergency Department: Payer: BC Managed Care – PPO | Admitting: Emergency Medicine

## 2011-01-18 ENCOUNTER — Ambulatory Visit: Payer: BC Managed Care – PPO

## 2011-05-14 ENCOUNTER — Telehealth: Payer: Self-pay

## 2011-05-14 MED ORDER — METOPROLOL SUCCINATE ER 25 MG PO TB24: 25.0000 mg | ORAL_TABLET | Freq: Two times a day (BID) | ORAL | Status: DC

## 2011-05-14 NOTE — Telephone Encounter (Signed)
Refill sent for metoprolol succ er 25 mg take one tablet bid.

## 2011-05-26 ENCOUNTER — Emergency Department: Payer: BC Managed Care – PPO | Admitting: Unknown Physician Specialty

## 2011-06-14 ENCOUNTER — Encounter: Payer: Self-pay | Admitting: Cardiovascular Disease

## 2011-06-14 ENCOUNTER — Ambulatory Visit (INDEPENDENT_AMBULATORY_CARE_PROVIDER_SITE_OTHER): Payer: Medicare Other | Admitting: Cardiovascular Disease

## 2011-06-14 DIAGNOSIS — I259 Chronic ischemic heart disease, unspecified: Secondary | ICD-10-CM

## 2011-06-14 DIAGNOSIS — R0602 Shortness of breath: Secondary | ICD-10-CM

## 2011-06-14 DIAGNOSIS — R002 Palpitations: Secondary | ICD-10-CM

## 2011-06-14 DIAGNOSIS — R413 Other amnesia: Secondary | ICD-10-CM

## 2011-06-14 DIAGNOSIS — I10 Essential (primary) hypertension: Secondary | ICD-10-CM

## 2011-06-14 DIAGNOSIS — E782 Mixed hyperlipidemia: Secondary | ICD-10-CM

## 2011-06-14 DIAGNOSIS — J449 Chronic obstructive pulmonary disease, unspecified: Secondary | ICD-10-CM

## 2011-06-14 DIAGNOSIS — R079 Chest pain, unspecified: Secondary | ICD-10-CM

## 2011-06-14 MED ORDER — DILTIAZEM HCL 30 MG PO TABS: 30.0000 mg | ORAL_TABLET | Freq: Three times a day (TID) | ORAL | Status: DC | PRN

## 2011-06-14 NOTE — Patient Instructions (Signed)
You are doing well. Please take an extra metoprolol for tachycardia If it does not resolve, take a diltiazem one pill, up to two pills.  If you start to have more of these episodes, please increase the metoprolol to 50 mg twice a day   Please call us if you have new issues that need to be addressed before your next appt.  Your physician wants you to follow-up in: 6 months.  You will receive a reminder letter in the mail two months in advance. If you don't receive a letter, please call our office to schedule the follow-up appointment.

## 2011-06-14 NOTE — Assessment & Plan Note (Signed)
Significant Underlying lung disease. No active infectious process and he appears stable.

## 2011-06-14 NOTE — Assessment & Plan Note (Signed)
He has been taking Crestor. We have encouraged him to stay on this.

## 2011-06-14 NOTE — Assessment & Plan Note (Signed)
He could be having paroxysmal tachy arrhythmia, unable to exclude atrial fibrillation.  Events are rare and typically resolve with extra metoprolol.  We have also given him diltiazem to take p.r.n. For these episodes. I suggested if he has additional episodes, that he contact us. If they're frequent, we could do a Holter monitor or event monitor.

## 2011-06-14 NOTE — Assessment & Plan Note (Signed)
His wife reports his memory is getting worse. He has seen a neurologist in the past. I suggested he followup with them to discuss if they need pharmacologic therapy.

## 2011-06-14 NOTE — Assessment & Plan Note (Signed)
He does not report any significant chest pain. He was given nitroglycerin to take p.r.n. His wife does not think that he needs to take this. He denies any significant symptoms at this time

## 2011-06-14 NOTE — Progress Notes (Signed)
Patient ID: Bradley Holt, male    DOB: Sep 22, 1919, 78 y.o.   MRN: [id number]  HPI Comments: 78 year old gentleman with history of coronary artery disease, angioplasty of proximal RCA lesion estimated at 90% in 1994, Previous episodes of tachy palpitations and mild shortness of breath , Recent admission to [facility] for chest pain after arguing with his wife, with negative stress test (lexiscan Myoview), who presents for routine followup. H/o  severe COPD,  remote smoking history.   He does complain about his memory.  He continues on oxygen, currently on at least 3 L. Otherwise he feels well with no complaints here no further chest pain. He did have an episode of tachycardia with palpitations recently lasting several hours. He took an extra metoprolol and his symptoms seemed to resolve.   EKG shows normal sinus rhythm with rate 77 beats per minute, no significant ST or T wave changes, LAD  cholesterol has been at goal      Outpatient Encounter Prescriptions as of 12/22/2010  Medication Sig Dispense Refill  . albuterol (VENTOLIN HFA) 108 (90 BASE) MCG/ACT inhaler Inhale 2 puffs into the lungs every 6 (six) hours as needed.        Marland Kitchen aspirin 81 MG EC tablet Take 81 mg by mouth daily.        . budesonide-formoterol (SYMBICORT) 80-4.5 MCG/ACT inhaler Inhale 2 puffs into the lungs 2 (two) times daily.        . cetirizine (ZYRTEC) 10 MG tablet Take 10 mg by mouth as needed.        Marland Kitchen losartan (COZAAR) 50 MG tablet Take 50 mg by mouth daily.        . metoprolol succinate (TOPROL-XL) 25 MG 24 hr tablet Take 1.5 tablets by mouth daily.      . nitroGLYCERIN (NITROSTAT) 0.4 MG SL tablet Place 1 tablet (0.4 mg total) under the tongue every 5 (five) minutes as needed.  25 tablet  3  . NON FORMULARY O2. 2 liters daily.       Marland Kitchen tiotropium (SPIRIVA) 18 MCG inhalation capsule Place 18 mcg into inhaler and inhale as directed.        . rosuvastatin (CRESTOR) 40 MG tablet Take 40 mg by mouth daily.            Review of Systems  HENT: Negative.   Eyes: Negative.   Respiratory: Positive for shortness of breath.   Cardiovascular: Positive for palpitations.  Gastrointestinal: Negative.   Musculoskeletal: Negative.   Skin: Negative.   Neurological: Positive for weakness.  Hematological: Negative.   Psychiatric/Behavioral: Negative.   All other systems reviewed and are negative.    BP 150/70  Pulse 77  Ht 5\' 7"  (1.702 m)  Wt 176 lb (79.833 kg)  BMI 27.57 kg/m2  Physical Exam  Nursing note and vitals reviewed. Constitutional: He is oriented to person, place, and time. He appears well-developed and well-nourished.       On oxygen 3.5 L nasal cannula.  HENT:  Head: Normocephalic.  Nose: Nose normal.  Mouth/Throat: Oropharynx is clear and moist.  Eyes: Conjunctivae are normal. Pupils are equal, round, and reactive to light.  Neck: Normal range of motion. Neck supple. No JVD present.  Cardiovascular: Normal rate, regular rhythm, S1 normal, S2 normal, normal heart sounds and intact distal pulses.  Exam reveals no gallop and no friction rub.   No murmur heard. Pulmonary/Chest: Effort normal and breath sounds normal. No respiratory distress. He has no wheezes. He has no rales.  He exhibits no tenderness.  Abdominal: Soft. Bowel sounds are normal. He exhibits no distension. There is no tenderness.  Musculoskeletal: Normal range of motion. He exhibits no edema and no tenderness.  Lymphadenopathy:    He has no cervical adenopathy.  Neurological: He is alert and oriented to person, place, and time. Coordination normal.  Skin: Skin is warm and dry. No rash noted. No erythema.  Psychiatric: He has a normal mood and affect. His behavior is normal. Judgment and thought content normal.           Assessment and Plan

## 2011-06-14 NOTE — Assessment & Plan Note (Signed)
He reports good blood pressure at home. Mildly elevated here. No changes made.

## 2011-06-27 ENCOUNTER — Telehealth: Payer: Self-pay | Admitting: Cardiovascular Disease

## 2011-06-27 NOTE — Telephone Encounter (Signed)
Called pt back to see if needs EKG now, per wife, pt does not have irregular heart beat now, but has had an episode and he took extra metoprolol and 1 diltiazem. Per note, he is to take extra metop, if no relief up to 2 diltiazem, and if irreg hr continues to incr metoprolol 50 BID. I restated this to pt, and he is going to try this and if symptoms are becoming more frequent with this regimen, he is going to call for [place] to place monitor.

## 2011-06-27 NOTE — Telephone Encounter (Signed)
Would place monitor if sx getting worse

## 2011-06-27 NOTE — Telephone Encounter (Signed)
Pt. Would like to be seen today for irreg.heartbeat.

## 2011-06-29 NOTE — Telephone Encounter (Signed)
LMOM to call office.  

## 2011-07-02 NOTE — Telephone Encounter (Signed)
Spoke to pt's wife, notified of msg. They will call back if sx worsen.

## 2011-08-30 ENCOUNTER — Ambulatory Visit: Payer: Self-pay | Admitting: Internal Medicine

## 2011-10-19 ENCOUNTER — Telehealth: Payer: Self-pay | Admitting: Cardiovascular Disease

## 2011-10-19 DIAGNOSIS — E7800 Pure hypercholesterolemia, unspecified: Secondary | ICD-10-CM

## 2011-10-19 NOTE — Telephone Encounter (Signed)
Pt calling concerned about husband script for crestor. States he is taking one thing and refills have been sent for another.

## 2011-10-19 NOTE — Telephone Encounter (Signed)
I spoke with the patient's wife. She states that the patient's prescription was filled by Dr. Graciela Husbands (PCP) for Crestor 20 mg. I explained I have that he is on 40 mg once daily of Crestor. She states the patient was never on this dose before, he has only been on 20 mg once daily. I advised he should stay on Crestor 20 mg once day. I will change this in the patient's chart.

## 2011-11-09 ENCOUNTER — Emergency Department: Payer: Self-pay | Admitting: Emergency Medicine

## 2011-11-09 LAB — COMPREHENSIVE METABOLIC PANEL
Albumin: 3.6 g/dL (ref 3.4–5.0)
Anion Gap: 9 (ref 7–16)
Bilirubin,Total: 0.9 mg/dL (ref 0.2–1.0)
Chloride: 111 mmol/L — ABNORMAL HIGH (ref 98–107)
Co2: 26 mmol/L (ref 21–32)
Creatinine: 1.5 mg/dL — ABNORMAL HIGH (ref 0.60–1.30)
EGFR (African American): 47 — ABNORMAL LOW
EGFR (Non-African Amer.): 41 — ABNORMAL LOW
Osmolality: 292 (ref 275–301)
Potassium: 4.3 mmol/L (ref 3.5–5.1)
SGOT(AST): 19 U/L (ref 15–37)
SGPT (ALT): 16 U/L
Sodium: 146 mmol/L — ABNORMAL HIGH (ref 136–145)
Total Protein: 7.1 g/dL (ref 6.4–8.2)

## 2011-11-09 LAB — CBC
HCT: 41 % (ref 40.0–52.0)
HGB: 13.4 g/dL (ref 13.0–18.0)
MCH: 31.5 pg (ref 26.0–34.0)
MCHC: 32.7 g/dL (ref 32.0–36.0)
MCV: 96 fL (ref 80–100)
WBC: 6.7 10*3/uL (ref 3.8–10.6)

## 2011-11-09 LAB — URINALYSIS, COMPLETE
Bacteria: NONE SEEN
Bilirubin,UR: NEGATIVE
Leukocyte Esterase: NEGATIVE
Nitrite: NEGATIVE
Ph: 5 (ref 4.5–8.0)
Specific Gravity: 1.013 (ref 1.003–1.030)
WBC UR: <1 /HPF (ref 0–5)

## 2011-11-09 LAB — TROPONIN I: Troponin-I: <0.02 ng/mL

## 2011-12-11 ENCOUNTER — Telehealth: Payer: Self-pay

## 2011-12-11 NOTE — Telephone Encounter (Signed)
Error

## 2011-12-14 ENCOUNTER — Encounter: Payer: Self-pay | Admitting: Cardiovascular Disease

## 2011-12-14 ENCOUNTER — Ambulatory Visit (INDEPENDENT_AMBULATORY_CARE_PROVIDER_SITE_OTHER): Payer: Medicare Other | Admitting: Cardiovascular Disease

## 2011-12-14 VITALS — BP 133/70 | HR 77 | Ht 67.0 in | Wt 167.0 lb

## 2011-12-14 DIAGNOSIS — I2789 Other specified pulmonary heart diseases: Secondary | ICD-10-CM

## 2011-12-14 DIAGNOSIS — R0989 Other specified symptoms and signs involving the circulatory and respiratory systems: Secondary | ICD-10-CM

## 2011-12-14 DIAGNOSIS — I10 Essential (primary) hypertension: Secondary | ICD-10-CM

## 2011-12-14 DIAGNOSIS — I272 Pulmonary hypertension, unspecified: Secondary | ICD-10-CM

## 2011-12-14 DIAGNOSIS — R413 Other amnesia: Secondary | ICD-10-CM

## 2011-12-14 DIAGNOSIS — R0602 Shortness of breath: Secondary | ICD-10-CM

## 2011-12-14 DIAGNOSIS — I2510 Atherosclerotic heart disease of native coronary artery without angina pectoris: Secondary | ICD-10-CM

## 2011-12-14 MED ORDER — FUROSEMIDE 20 MG PO TABS: 20.0000 mg | ORAL_TABLET | Freq: Every day | ORAL | Status: DC | PRN

## 2011-12-14 MED ORDER — POTASSIUM CHLORIDE ER 10 MEQ PO TBCR: 10.0000 meq | EXTENDED_RELEASE_TABLET | Freq: Every day | ORAL | Status: DC

## 2011-12-14 NOTE — Assessment & Plan Note (Signed)
I believe he has a mix of dementia and depression. Her sat does not seem to be helping his symptoms as much as they would like. I suggested they talk with Dr. Sherryll Burger and Dr. Graciela Husbands about other possible medications including excelon patch, namenda, etc. and also including various antidepressants such as Prozac or Zoloft. Have asked him to talk with the [facility] at [facility] to see what they would recommend him for him to start daily activities there. He would benefit from regular exercise but he is not particularly interested. Perhaps this can be arranged through the memory Center. I've asked him to call Dr. Sherryll Burger for an earlier appointment. He may also benefit from psychiatry appointment/therapy and medication management for depression.

## 2011-12-14 NOTE — Assessment & Plan Note (Signed)
Elevated pressures seen on outside echocardiogram. We will start Lasix 20 mg every other day with potassium 10 mEq. We'll arrange blood work in several weeks to monitor for dehydration and worsening renal function. We will likely pushed gently with diuretics and cut back with any worsening renal function. He may benefit from 3 times a day dosing revatio. This was started by Dr. Park Breed.

## 2011-12-14 NOTE — Patient Instructions (Addendum)
Please start lasix/furosemide every other day Take with potassium We will check the kidney function in 2 to 3 weeks  Talk with Dr. Graciela Husbands about starting excelon patch, or namenda for memory Call Dr. Sherryll Burger for earlier appt. And possible Medication for depression:  Prozac, zoloft  We need to increase your exercise Talk with the memory center about which doctors they use for memory  Please call us if you have new issues that need to be addressed before your next appt.  Your physician wants you to follow-up in: 3 months.  You will receive a reminder letter in the mail two months in advance. If you don't receive a letter, please call our office to schedule the follow-up appointment.

## 2011-12-14 NOTE — Assessment & Plan Note (Signed)
Blood pressure is well controlled on today's visit. No changes made to the medications. 

## 2011-12-14 NOTE — Assessment & Plan Note (Signed)
Currently with no symptoms of angina. No further workup at this time. Continue current medication regimen. 

## 2011-12-14 NOTE — Progress Notes (Signed)
Patient ID: Bradley Holt, male    DOB: 06-Mar-1920, 78 y.o.   MRN: [id number]  HPI Comments: 78 year old gentleman with history of coronary artery disease, angioplasty of proximal RCA lesion estimated at 90% in 1994, Previous episodes of tachy palpitations and mild shortness of breath , Recent admission to [facility] for chest pain after arguing with his wife, with negative stress test (lexiscan Myoview), who presents for routine followup. H/o  severe COPD,  remote smoking history. He presents for routine followup.  He denies any new cardiac issues and no significant palpitations. His biggest issue is his memory, loss of weight, possible depression, feeling frustrated about his inactivity and memory. He has lost the ability to taste food. He and his wife are frustrated and don't know what to do. He reports having had recent visit with Dr. Graciela Husbands. He does not have followup with Dr. Sherryll Burger of neurology for some time (they were told 78 year). He did not feel that the Aricept is doing enough. He continues to use oxygen.  Recent echocardiogram showed at least moderate pulmonary hypertension with right ventricular systolic pressures of 60, normal ejection fraction, dilated inferior vena cava (study was done by the outside facility and images are not available)  He continues on oxygen, currently on at least 3 L. e no further chest pain.   EKG shows normal sinus rhythm with rate 77 beats per minute, no significant ST or T wave changes cholesterol has been at goal     Outpatient Encounter Prescriptions as of 12/14/2011  Medication Sig Dispense Refill  . albuterol (VENTOLIN HFA) 108 (90 BASE) MCG/ACT inhaler Inhale 2 puffs into the lungs every 6 (six) hours as needed.        Marland Kitchen aspirin 81 MG EC tablet Take 81 mg by mouth daily.        . budesonide-formoterol (SYMBICORT) 80-4.5 MCG/ACT inhaler Inhale 2 puffs into the lungs 2 (two) times daily.        . cetirizine (ZYRTEC) 10 MG tablet Take 10 mg by mouth as  needed.        . diltiazem (CARDIZEM) 30 MG tablet Take 1 tablet (30 mg total) by mouth 3 (three) times daily as needed.  90 tablet  11  . donepezil (ARICEPT) 10 MG tablet Take 1 tablet by mouth Daily.      Marland Kitchen losartan (COZAAR) 50 MG tablet Take 50 mg by mouth daily.        . metoprolol succinate (TOPROL-XL) 25 MG 24 hr tablet Take 1 tablet (25 mg total) by mouth 2 (two) times daily.  60 tablet  6  . NON FORMULARY Oxygen 3.5 liters daily.      . rosuvastatin (CRESTOR) 20 MG tablet Take 1 tablet (20 mg total) by mouth daily.      . sildenafil (REVATIO) 20 MG tablet Take 1 tablet by mouth Daily.      Marland Kitchen tiotropium (SPIRIVA) 18 MCG inhalation capsule Place 18 mcg into inhaler and inhale as directed.        . furosemide (LASIX) 20 MG tablet Take 1 tablet (20 mg total) by mouth daily as needed.  30 tablet  6  . potassium chloride (K-DUR) 10 MEQ tablet Take 1 tablet (10 mEq total) by mouth daily.  30 tablet  6  . DISCONTD: nitroGLYCERIN (NITROSTAT) 0.4 MG SL tablet Place 1 tablet (0.4 mg total) under the tongue every 5 (five) minutes as needed.  25 tablet  3   Review of Systems  HENT:  Negative.   Eyes: Negative.   Respiratory: Positive for shortness of breath.   Gastrointestinal: Negative.   Musculoskeletal: Negative.   Skin: Negative.   Hematological: Negative.   Psychiatric/Behavioral: Positive for dysphoric mood and decreased concentration.       Poor memory  All other systems reviewed and are negative.    BP 133/70  Pulse 77  Ht 5\' 7"  (1.702 m)  Wt 167 lb (75.751 kg)  BMI 26.16 kg/m2  Physical Exam  Nursing note and vitals reviewed. Constitutional: He is oriented to person, place, and time. He appears well-developed and well-nourished.       On oxygen 3.5 L nasal cannula.  HENT:  Head: Normocephalic.  Nose: Nose normal.  Mouth/Throat: Oropharynx is clear and moist.  Eyes: Conjunctivae are normal. Pupils are equal, round, and reactive to light.  Neck: Normal range of motion. Neck  supple. No JVD present.  Cardiovascular: Normal rate, regular rhythm, S1 normal, S2 normal, normal heart sounds and intact distal pulses.  Exam reveals no gallop and no friction rub.   No murmur heard. Pulmonary/Chest: Effort normal and breath sounds normal. No respiratory distress. He has no wheezes. He has no rales. He exhibits no tenderness.  Abdominal: Soft. Bowel sounds are normal. He exhibits no distension. There is no tenderness.  Musculoskeletal: Normal range of motion. He exhibits no edema and no tenderness.  Lymphadenopathy:    He has no cervical adenopathy.  Neurological: He is alert and oriented to person, place, and time. Coordination normal.  Skin: Skin is warm and dry. No rash noted. No erythema.  Psychiatric: His behavior is normal. Judgment and thought content normal.       He appears somewhat depressed    Assessment and Plan

## 2011-12-18 ENCOUNTER — Inpatient Hospital Stay: Payer: Self-pay | Admitting: Internal Medicine

## 2011-12-18 LAB — CBC WITH DIFFERENTIAL/PLATELET
Basophil #: 0.1 10*3/uL (ref 0.0–0.1)
Basophil %: 1 %
HCT: 40.7 % (ref 40.0–52.0)
HGB: 13.1 g/dL (ref 13.0–18.0)
Lymphocyte %: 19.4 %
MCHC: 32.3 g/dL (ref 32.0–36.0)
MCV: 95 fL (ref 80–100)

## 2011-12-18 LAB — COMPREHENSIVE METABOLIC PANEL
Albumin: 3.6 g/dL (ref 3.4–5.0)
Alkaline Phosphatase: 91 U/L (ref 50–136)
Calcium, Total: 8.8 mg/dL (ref 8.5–10.1)
EGFR (Non-African Amer.): 34 — ABNORMAL LOW
Glucose: 128 mg/dL — ABNORMAL HIGH (ref 65–99)
Osmolality: 297 (ref 275–301)
SGOT(AST): 20 U/L (ref 15–37)
SGPT (ALT): 15 U/L
Sodium: 146 mmol/L — ABNORMAL HIGH (ref 136–145)

## 2011-12-18 LAB — URINALYSIS, COMPLETE
Blood: NEGATIVE
Leukocyte Esterase: NEGATIVE
Nitrite: NEGATIVE
Ph: 6 (ref 4.5–8.0)
Protein: NEGATIVE
RBC,UR: 1 /HPF (ref 0–5)
Squamous Epithelial: NONE SEEN

## 2011-12-19 LAB — CBC WITH DIFFERENTIAL/PLATELET
Basophil #: 0 10*3/uL (ref 0.0–0.1)
Eosinophil #: 0 10*3/uL (ref 0.0–0.7)
Lymphocyte %: 5.2 %
MCH: 30.8 pg (ref 26.0–34.0)
MCHC: 32.6 g/dL (ref 32.0–36.0)
Monocyte #: 0.1 x10 3/mm — ABNORMAL LOW (ref 0.2–1.0)
Neutrophil %: 93 %
Platelet: 167 10*3/uL (ref 150–440)

## 2011-12-19 LAB — CK TOTAL AND CKMB (NOT AT ARMC)
CK, Total: 85 U/L (ref 35–232)
CK, Total: 74 U/L (ref 35–232)
CK-MB: 1.1 ng/mL (ref 0.5–3.6)
CK-MB: 1.3 ng/mL (ref 0.5–3.6)

## 2011-12-19 LAB — BASIC METABOLIC PANEL
Calcium, Total: 8.5 mg/dL (ref 8.5–10.1)
Co2: 26 mmol/L (ref 21–32)
Creatinine: 1.66 mg/dL — ABNORMAL HIGH (ref 0.60–1.30)
Potassium: 4.9 mmol/L (ref 3.5–5.1)
Sodium: 143 mmol/L (ref 136–145)

## 2011-12-19 LAB — TROPONIN I: Troponin-I: <0.02 ng/mL

## 2011-12-19 LAB — CLOSTRIDIUM DIFFICILE BY PCR

## 2011-12-24 LAB — CULTURE, BLOOD (SINGLE)

## 2011-12-25 ENCOUNTER — Telehealth: Payer: Self-pay | Admitting: Cardiovascular Disease

## 2011-12-25 NOTE — Telephone Encounter (Signed)
Line busy

## 2011-12-25 NOTE — Telephone Encounter (Signed)
Pt wife called stating that pt was in the hospital last week for dehydration and hasn't been taking his lasix or potassium and wants to know if he needs to be taking these

## 2011-12-25 NOTE — Telephone Encounter (Signed)
I spoke with pt's wife who says pt was recently admitted to [facility] for dehydration.  She says his lasix and KCL were stopped.  She asks if pt should be taking these meds.  After looking at discharge summary I advised wife to have pt take lasix and KCL only PRN edema or weight gain.   Wife verb. Understanding and says pt is actually losing weight.  Again I stressed to her the importance of having pt hold lasix/KCL UNLESS he develops edema or weight gain.  Understanding verb. Also scheduled pt for hosp f/u appt with Dr. [name] for 7/22 at 2:30

## 2011-12-31 ENCOUNTER — Encounter: Payer: Self-pay | Admitting: Cardiovascular Disease

## 2011-12-31 ENCOUNTER — Ambulatory Visit (INDEPENDENT_AMBULATORY_CARE_PROVIDER_SITE_OTHER): Payer: Medicare Other | Admitting: Cardiovascular Disease

## 2011-12-31 VITALS — BP 108/48 | HR 76 | Ht 67.0 in | Wt 167.0 lb

## 2011-12-31 DIAGNOSIS — R413 Other amnesia: Secondary | ICD-10-CM

## 2011-12-31 DIAGNOSIS — E782 Mixed hyperlipidemia: Secondary | ICD-10-CM

## 2011-12-31 DIAGNOSIS — I272 Pulmonary hypertension, unspecified: Secondary | ICD-10-CM

## 2011-12-31 DIAGNOSIS — I2789 Other specified pulmonary heart diseases: Secondary | ICD-10-CM

## 2011-12-31 DIAGNOSIS — I10 Essential (primary) hypertension: Secondary | ICD-10-CM

## 2011-12-31 DIAGNOSIS — I259 Chronic ischemic heart disease, unspecified: Secondary | ICD-10-CM

## 2011-12-31 NOTE — Assessment & Plan Note (Signed)
Blood pressure is low. We have suggested if this continues to run low when they check the numbers at home, that they cut the losartan in half or stop the medication altogether.

## 2011-12-31 NOTE — Assessment & Plan Note (Signed)
Continues to have problems with recall. He and his wife are finding is very difficult to manage. He is relatively inactive.

## 2011-12-31 NOTE — Assessment & Plan Note (Signed)
Negative stress test last year. No symptoms of angina. Shortness of breath likely from pulmonary hypertension and COPD.

## 2011-12-31 NOTE — Patient Instructions (Addendum)
Take lasix three times a week, Monday, Wednesday Friday Hold for dehydration  Cut the losartan in 1/2 or hold the losartan for low blood pressure (less than 120 on the top number)  Please call us if you have new issues that need to be addressed before your next appt.  Your physician wants you to follow-up in: 3 months.  You will receive a reminder letter in the mail two months in advance. If you don't receive a letter, please call our office to schedule the follow-up appointment.

## 2011-12-31 NOTE — Assessment & Plan Note (Signed)
We have suggested he stay on his Crestor 

## 2011-12-31 NOTE — Assessment & Plan Note (Addendum)
I have mentioned to him that options are somewhat limited. If he has worsening shortness of breath and edema, we could try very gentle Lasix though this would likely make his renal function worse. He could try low-dose Lasix Monday Wednesday and Friday with potassium. He could take an extra pill for worsening symptoms. Diuretic plus the Viagra is probably the only option for now. There are other more expensive options including inhalers and subcutaneous injections but I'm uncertain as to whether this would provide him a significant improvement in his breathing.

## 2011-12-31 NOTE — Progress Notes (Signed)
Patient ID: Bradley Holt, male    DOB: 01-30-1920, 78 y.o.   MRN: [id number]  HPI Comments: 78 year old gentleman with history of coronary artery disease, angioplasty of proximal RCA lesion estimated at 90% in 1994, Previous episodes of tachy palpitations and mild shortness of breath ,  admission to [facility] for chest pain after arguing with his wife, with negative stress test (lexiscan Myoview),  H/o  severe COPD,  remote smoking history, pulmonary hypertension,  presents for routine followup. He had a recent admission to the hospital for malaise, shortness of breath, elevated creatinine.  He reports having mild increasing swelling of his legs, worsening shortness of breath since the hospital. They were told to stop the Lasix altogether. He reports having a difficult time walking in to the clinic from the parking lot. He denies any cough. He is currently being managed by hospice. He and his wife report that they were discouraged that he did not qualify for skilled nursing facility upon discharge and he went back to their house.   He continues to be bothered by memory/recall loss of weight, possible depression, feeling frustrated about his inactivity and memory. He has lost the ability to taste food. He has had followup with neurology in the past.  echocardiogram showed at least moderate pulmonary hypertension with right ventricular systolic pressures of 60, normal ejection fraction, dilated inferior vena cava (study was done by the outside facility and images are not available)  He continues on oxygen, currently on at least 3 L. [place]   EKG shows normal sinus rhythm with rate 76 beats per minute, no significant ST or T wave changes cholesterol has been at goal     Outpatient Encounter Prescriptions as of 12/31/2011  Medication Sig Dispense Refill  . albuterol (VENTOLIN HFA) 108 (90 BASE) MCG/ACT inhaler Inhale 2 puffs into the lungs every 6 (six) hours as needed.        Marland Kitchen aspirin 81 MG EC tablet  Take 81 mg by mouth daily.        . budesonide-formoterol (SYMBICORT) 80-4.5 MCG/ACT inhaler Inhale 2 puffs into the lungs 2 (two) times daily.        . cetirizine (ZYRTEC) 10 MG tablet Take 10 mg by mouth as needed.        . diltiazem (CARDIZEM) 30 MG tablet Take 1 tablet (30 mg total) by mouth 3 (three) times daily as needed.  90 tablet  11  . donepezil (ARICEPT) 10 MG tablet Take 1 tablet by mouth Daily.      Marland Kitchen losartan (COZAAR) 50 MG tablet Take 50 mg by mouth daily.        . metoprolol succinate (TOPROL-XL) 25 MG 24 hr tablet Take 1 tablet (25 mg total) by mouth 2 (two) times daily.  60 tablet  6  . NON FORMULARY Oxygen 3.5 liters daily.      . rosuvastatin (CRESTOR) 20 MG tablet Take 1 tablet (20 mg total) by mouth daily.      . sildenafil (REVATIO) 20 MG tablet Take 1 tablet by mouth Daily.      Marland Kitchen tiotropium (SPIRIVA) 18 MCG inhalation capsule Place 18 mcg into inhaler and inhale as directed.        . furosemide (LASIX) 20 MG tablet Take 1 tablet (20 mg total) by mouth daily as needed.  30 tablet  6  . potassium chloride (K-DUR) 10 MEQ tablet Take 1 tablet (10 mEq total) by mouth daily.  30 tablet  6   Review  of Systems  Constitutional: Negative.   HENT: Negative.   Eyes: Negative.   Respiratory: Positive for shortness of breath.   Cardiovascular: Positive for leg swelling.  Gastrointestinal: Negative.   Musculoskeletal: Negative.   Skin: Negative.   Neurological: Negative.   Hematological: Negative.   Psychiatric/Behavioral:       Poor memory  All other systems reviewed and are negative.    BP 108/48  Pulse 76  Ht 5\' 7"  (1.702 m)  Wt 167 lb (75.751 kg)  BMI 26.16 kg/m2  Physical Exam  Nursing note and vitals reviewed. Constitutional: He is oriented to person, place, and time. He appears well-developed and well-nourished.       On oxygen 3.5 L nasal cannula.  HENT:  Head: Normocephalic.  Nose: Nose normal.  Mouth/Throat: Oropharynx is clear and moist.  Eyes:  Conjunctivae are normal. Pupils are equal, round, and reactive to light.  Neck: Normal range of motion. Neck supple. No JVD present.  Cardiovascular: Normal rate, regular rhythm, S1 normal, S2 normal, normal heart sounds and intact distal pulses.  Exam reveals no gallop and no friction rub.   No murmur heard.      Trace edema around the ankles  Pulmonary/Chest: Effort normal. No respiratory distress. He has decreased breath sounds. He has no wheezes. He has no rales. He exhibits no tenderness.  Abdominal: Soft. Bowel sounds are normal. He exhibits no distension. There is no tenderness.  Musculoskeletal: Normal range of motion. He exhibits no edema and no tenderness.  Lymphadenopathy:    He has no cervical adenopathy.  Neurological: He is alert and oriented to person, place, and time. Coordination normal.  Skin: Skin is warm and dry. No rash noted. No erythema.  Psychiatric: His behavior is normal. Judgment and thought content normal.       He appears somewhat depressed    Assessment and Plan

## 2012-01-24 IMAGING — CT CT HEAD WITHOUT CONTRAST
1 series · 16 of 30 positions shown, 20 images · non-contrast
Comparison: none

REASON FOR EXAM: progressive memory loss
COMMENTS:

[Series 2: 3 soft tissue · axial · 0.42mm/px · z∈[-130,+5]mm · 16 of 31 slices shown, 20 images]
[im 2/31  brain]
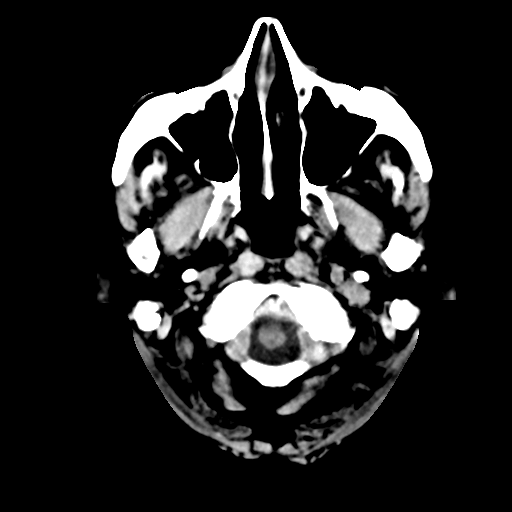
[im 2/31  bone]
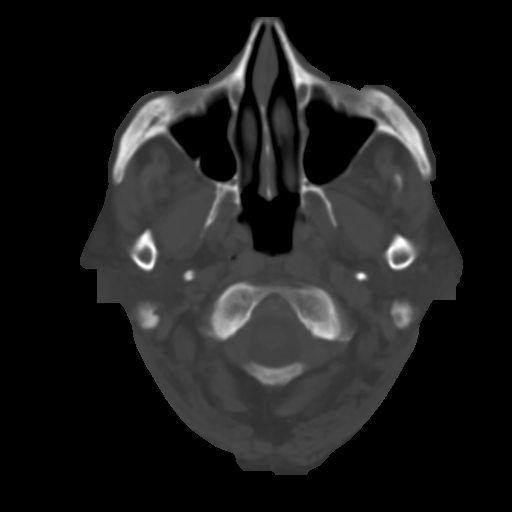
[im 4/31  brain]
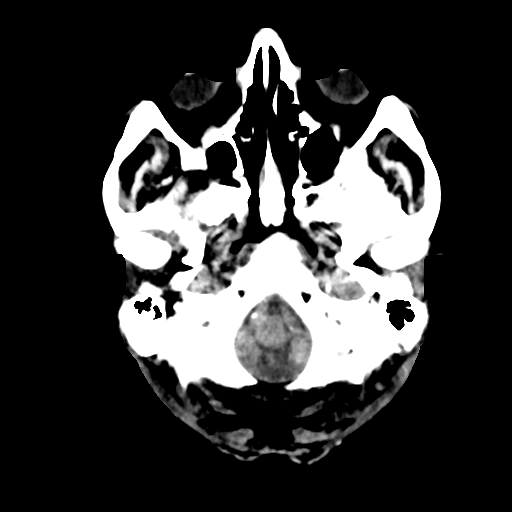
[im 6/31  brain]
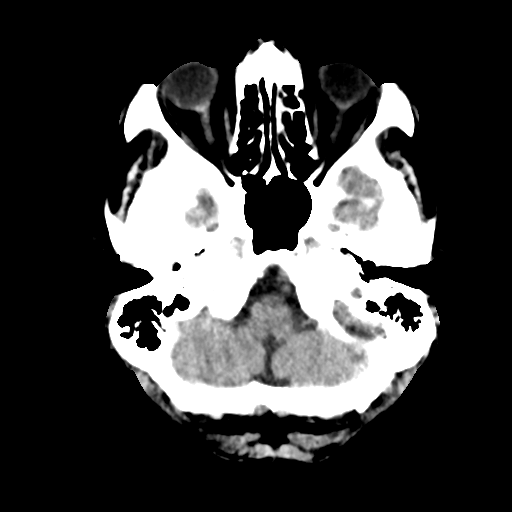
[im 8/31  brain]
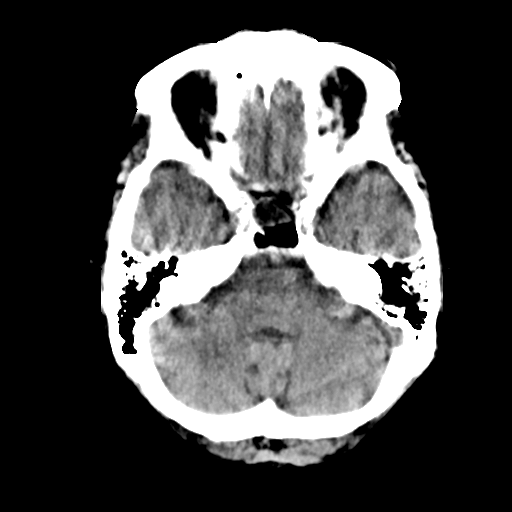
[im 9/31  brain]
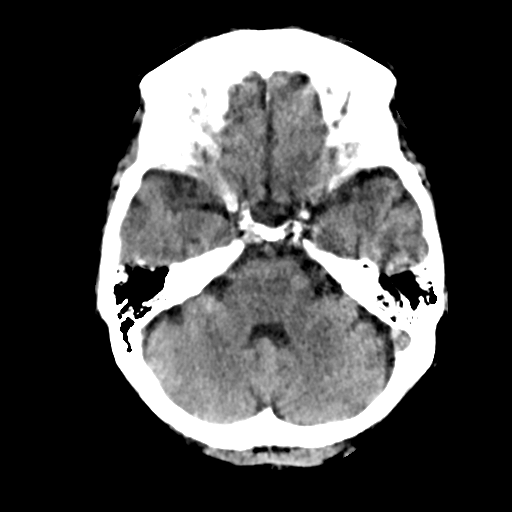
[im 9/31  bone]
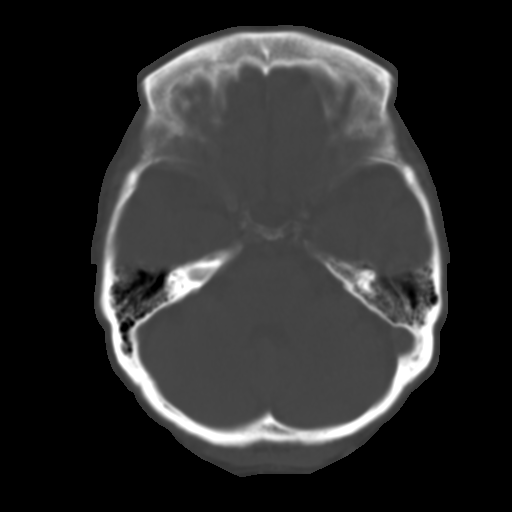
[im 11/31  brain]
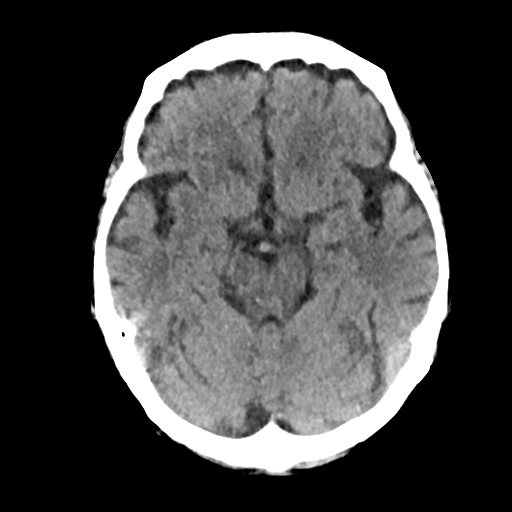
[im 13/31  brain]
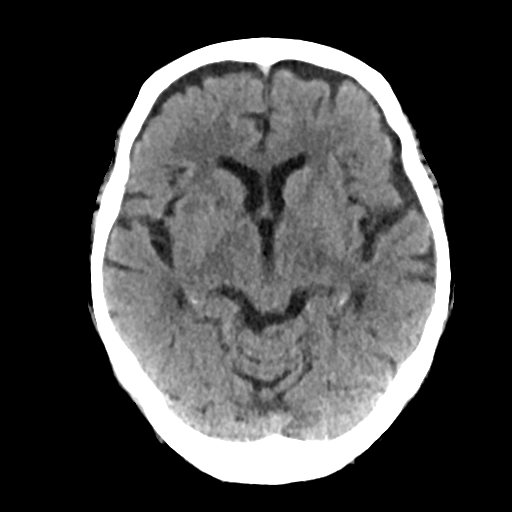
[im 15/31  brain]
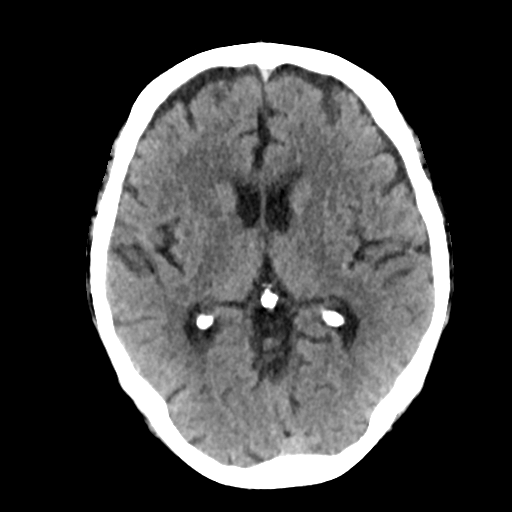
[im 16/31  brain]
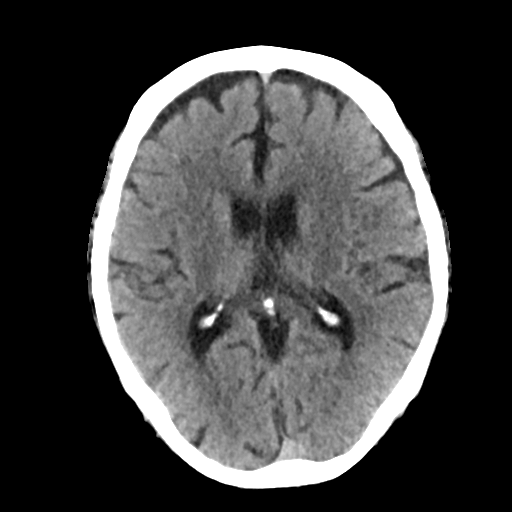
[im 16/31  bone]
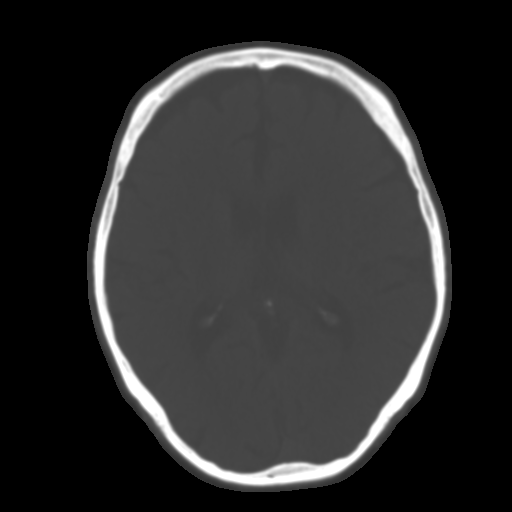
[im 18/31  brain]
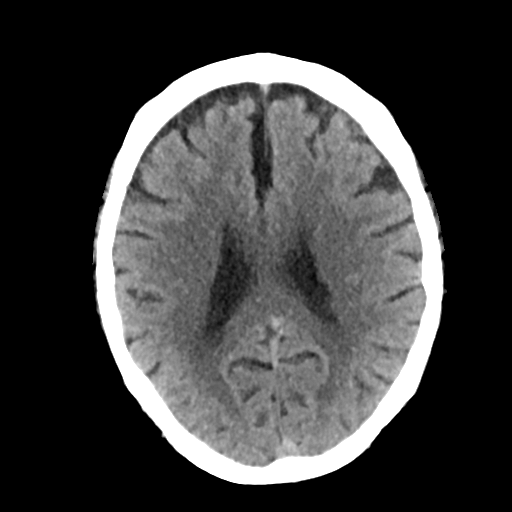
[im 20/31  brain]
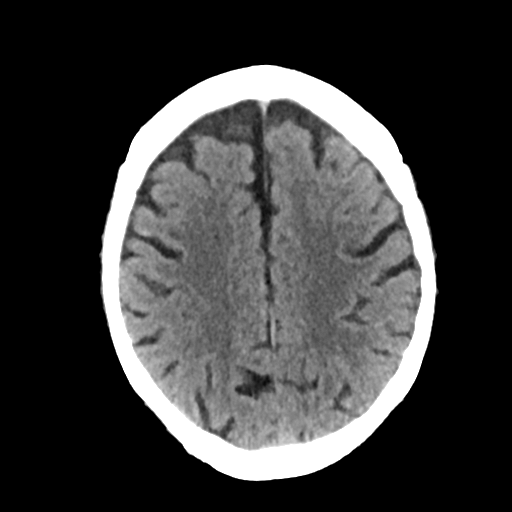
[im 22/31  brain]
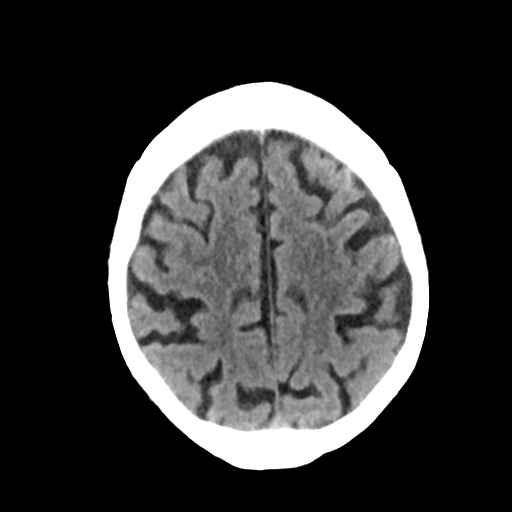
[im 23/31  brain]
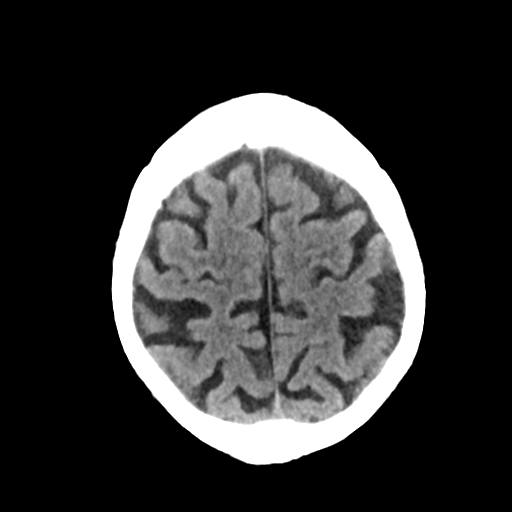
[im 23/31  bone]
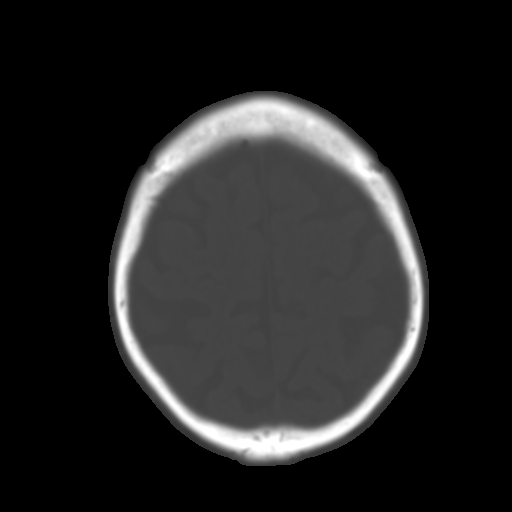
[im 25/31  brain]
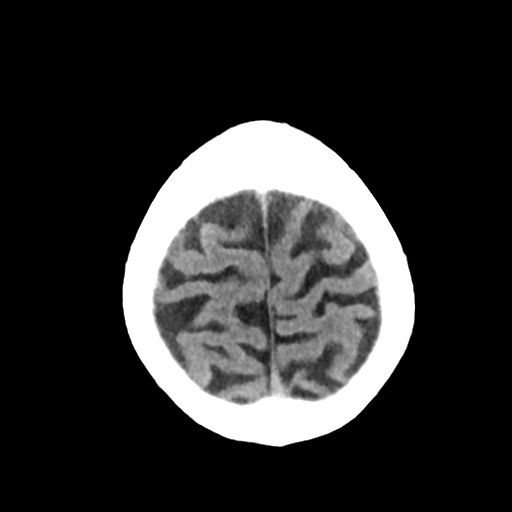
[im 27/31  brain]
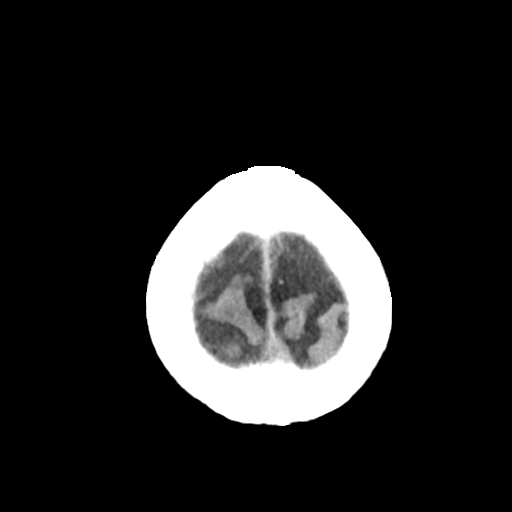
[im 29/31  brain]
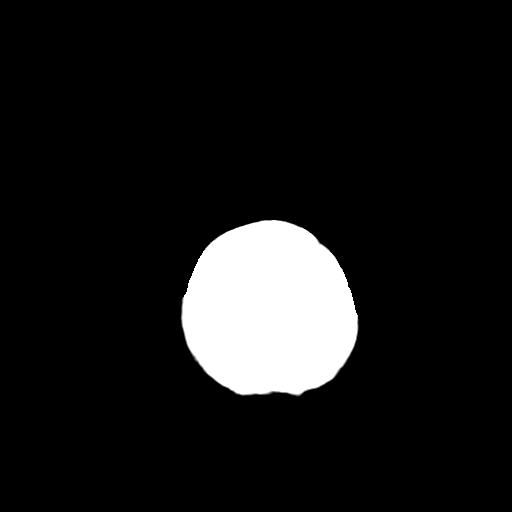

[16 of 30 positions shown; findings below may reference images not displayed]

PROCEDURE:     KCT - KCT HEAD WITHOUT CONTRAST  - December 22, 2010  [DATE]

RESULT:     Axial noncontrast CT scanning was performed through the brain at
5 mm intervals and slice thicknesses.

There is mild diffuse cerebral and cerebellar atrophy consistent with the
patient's age. The ventricles are normal in size and position. There is no
intracranial hemorrhage nor intracranial mass effect nor evidence of an
evolving ischemic infarction. At bone window settings the observed portions
of the paranasal sinuses and mastoid air cells are clear.
IMPRESSION: I see no acute intracranial abnormality. There are
age-related atrophic changes diffusely.

## 2012-02-17 IMAGING — CR DG CHEST 1V PORT
1 series · 1 of 1 positions shown · non-contrast
Comparison: none

REASON FOR EXAM: cp
COMMENTS:

[view not recorded]
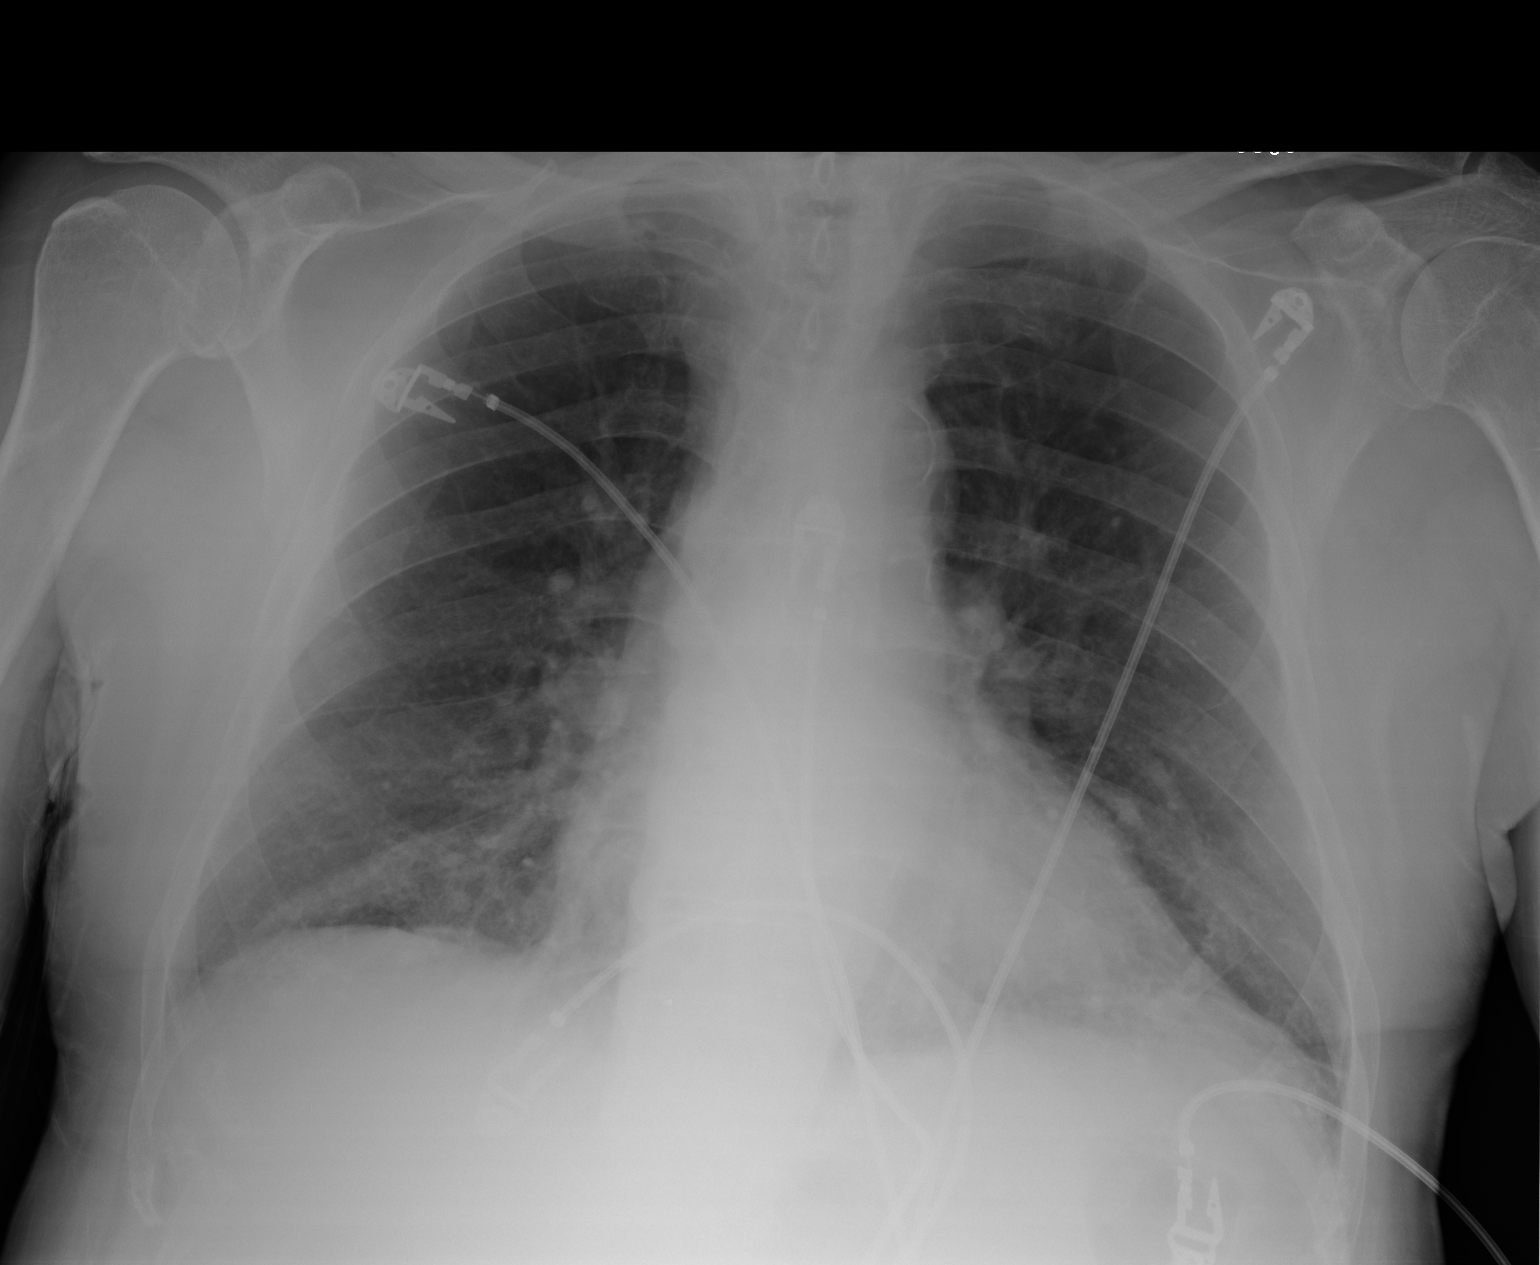

[1 of 1 positions shown; findings below may reference images not displayed]

PROCEDURE:     DXR - DXR PORTABLE CHEST SINGLE VIEW  - January 15, 2011 [DATE]

RESULT:     Comparison is made to the study 03 December, 2010.

The lungs are adequately inflated. The lung markings are coarse in the
perihilar regions and in the lower right infrahilar region. The cardiac
silhouette is normal in size. The pulmonary vascularity is slightly less
distinct today than on the earlier study.
IMPRESSION: The findings may reflect low-grade CHF. There is no focal
pneumonia. Minimal atelectatic change at the right lung base is present but
not entirely new. When the patient can tolerate the procedure, a PA and
lateral chest x-ray would be of value.

## 2012-02-20 ENCOUNTER — Ambulatory Visit (INDEPENDENT_AMBULATORY_CARE_PROVIDER_SITE_OTHER): Payer: Medicare Other

## 2012-02-20 DIAGNOSIS — R0602 Shortness of breath: Secondary | ICD-10-CM

## 2012-02-21 LAB — BASIC METABOLIC PANEL
BUN/Creatinine Ratio: 14 (ref 10–22)
CO2: 22 mmol/L (ref 19–28)
Calcium: 9 mg/dL (ref 8.6–10.2)
Chloride: 104 mmol/L (ref 97–108)
Potassium: 4.9 mmol/L (ref 3.5–5.2)
Sodium: 141 mmol/L (ref 134–144)

## 2012-03-07 ENCOUNTER — Ambulatory Visit: Payer: Medicare Other | Admitting: Internal Medicine

## 2012-03-14 ENCOUNTER — Ambulatory Visit: Payer: Medicare Other | Admitting: Internal Medicine

## 2012-03-17 ENCOUNTER — Encounter: Payer: Self-pay | Admitting: Internal Medicine

## 2012-03-17 ENCOUNTER — Ambulatory Visit (INDEPENDENT_AMBULATORY_CARE_PROVIDER_SITE_OTHER): Payer: Medicare Other | Admitting: Internal Medicine

## 2012-03-17 VITALS — BP 148/70 | HR 89 | Temp 98.1°F | Ht 67.0 in | Wt 160.0 lb

## 2012-03-17 DIAGNOSIS — G309 Alzheimer's disease, unspecified: Secondary | ICD-10-CM

## 2012-03-17 DIAGNOSIS — F32A Depression, unspecified: Secondary | ICD-10-CM | POA: Insufficient documentation

## 2012-03-17 DIAGNOSIS — I272 Pulmonary hypertension, unspecified: Secondary | ICD-10-CM

## 2012-03-17 DIAGNOSIS — J449 Chronic obstructive pulmonary disease, unspecified: Secondary | ICD-10-CM

## 2012-03-17 DIAGNOSIS — I259 Chronic ischemic heart disease, unspecified: Secondary | ICD-10-CM

## 2012-03-17 DIAGNOSIS — F329 Major depressive disorder, single episode, unspecified: Secondary | ICD-10-CM | POA: Insufficient documentation

## 2012-03-17 DIAGNOSIS — F0280 Dementia in other diseases classified elsewhere without behavioral disturbance: Secondary | ICD-10-CM | POA: Insufficient documentation

## 2012-03-17 DIAGNOSIS — I2789 Other specified pulmonary heart diseases: Secondary | ICD-10-CM

## 2012-03-17 DIAGNOSIS — N189 Chronic kidney disease, unspecified: Secondary | ICD-10-CM | POA: Insufficient documentation

## 2012-03-17 DIAGNOSIS — Z23 Encounter for immunization: Secondary | ICD-10-CM

## 2012-03-17 NOTE — Assessment & Plan Note (Signed)
Presumably his admitting diagnosis for hospice NYHA class 3 symptoms at least Now on revatio---some mild orthostasis but seems to be tolerating

## 2012-03-17 NOTE — Progress Notes (Signed)
Subjective:    Patient ID: Bradley Holt, male    DOB: 1919/07/13, 78 y.o.   MRN: [id number]  HPI Here to establish Had been seen by Dr Graciela Husbands at [facility] They wanted someone with more experience with the elderly and dementia  On hospice care now ?diagnosis of pulmonary hypertension as admitting diagnosis  Having memory problems  He notes always having a problem with recall Used to be on the computer with investments, etc---can't do that now Doesn't pay the bills now Doesn't need help with ADLs. [place] No longer drives Joins wife for grocery shopping. Formerly did cooking---can't anymore Helps cleaning dishes No confusion in general. Did have one episode of disorientation in home---wife able to reorient him He downplays this with her Had been on donepezil---no help and is now off that now  Stopped smoking ~40 years ago Diagnosed with COPD Only occ cough Doesn't seem particularly effective that they can tell Uses the ventolin regularly tid  Pulmonary hypertension Now on revatio Followed by cardiology Tries to move slowly but is very limited----can't walk flat to mailbox without resting   Review of Systems  Constitutional: Positive for unexpected weight change. Negative for appetite change.       Lost 20# and hasn't been able to gain it back No taste  HENT: Positive for hearing loss. Negative for dental problem.        Dentures with 4 of his own teeth  Eyes: Positive for visual disturbance.       Vision poor with "wet eyes"  Respiratory: Positive for cough and shortness of breath.   Cardiovascular: Positive for leg swelling. Negative for chest pain and palpitations.       Uses support socks  Gastrointestinal: Negative for nausea, vomiting, abdominal pain and constipation.       Bowels are okay No heartburn No dysphagia unless big pill  Genitourinary: Negative for urgency, frequency and difficulty urinating.  Musculoskeletal: Negative for back pain and  arthralgias.  Skin: Negative for rash.       No suspicious areas  Neurological: Positive for dizziness and light-headedness. Negative for syncope and headaches.       Dizzy if he gets up too fast  Hematological: Negative for adenopathy. Bruises/bleeds easily.  Psychiatric/Behavioral: Positive for dysphoric mood. Negative for disturbed wake/sleep cycle. The patient is not nervous/anxious.        Has had periods of depression--especially 4-5 years ago. Seems to be related to his worsening memory       Objective:   Physical Exam  Constitutional: He appears well-developed and well-nourished. No distress.  HENT:  Mouth/Throat: Oropharynx is clear and moist. No oropharyngeal exudate.  Eyes: Conjunctivae normal are normal. Pupils are equal, round, and reactive to light.  Neck: Normal range of motion. Neck supple. No thyromegaly present.  Cardiovascular: Normal rate, regular rhythm, normal heart sounds and intact distal pulses.  Exam reveals no gallop.   No murmur heard. Pulmonary/Chest: Effort normal. No respiratory distress. He has no wheezes. He has no rales.       Decreased breath sounds but clear  Abdominal: Soft. There is no tenderness.  Musculoskeletal: He exhibits no edema and no tenderness.  Lymphadenopathy:    He has no cervical adenopathy.  Neurological:       Memory is poor--forgets easy and I have to repeat myself. Some degree of denial and contradicts wife's recounting of his deficits  Skin: No rash noted.  Psychiatric: He has a normal mood and affect. His behavior  is normal.          Assessment & Plan:

## 2012-03-17 NOTE — Assessment & Plan Note (Signed)
Will continue to monitor this. 

## 2012-03-17 NOTE — Addendum Note (Signed)
Addended by: Sueanne Margarita on: 03/17/2012 04:10 PM   Modules accepted: Orders

## 2012-03-17 NOTE — Assessment & Plan Note (Signed)
was a long time smoker but quit long ago Dyspnea probably more related to pulmonary hypertension Will try off spiriva to take away possible anticholinergic effect

## 2012-03-17 NOTE — Assessment & Plan Note (Signed)
Has typical gradual decline and lack of insight Didn't do well with the donepezil No Rx for now Is on asa---secondary prophylaxis for stroke

## 2012-03-17 NOTE — Assessment & Plan Note (Signed)
Mood seems to be okay on the sertraline Will continue

## 2012-03-17 NOTE — Assessment & Plan Note (Signed)
No active symptoms On appropriate regimen for this by Dr [name] EF 55% by echo in April 2013

## 2012-03-25 ENCOUNTER — Telehealth: Payer: Self-pay

## 2012-03-25 NOTE — Telephone Encounter (Signed)
Pt living at [facility]; will need form for pt to receive home care at pts home. pts wife admitted to [facility] and pts wife is primary care giver to pt. Pt's son Roe Coombs calls to start process to get pt in home care. Roe Coombs will check with [facility] to see what is needed.

## 2012-03-26 ENCOUNTER — Other Ambulatory Visit: Payer: Self-pay

## 2012-03-26 ENCOUNTER — Telehealth: Payer: Self-pay

## 2012-03-26 DIAGNOSIS — I10 Essential (primary) hypertension: Secondary | ICD-10-CM

## 2012-03-26 NOTE — Telephone Encounter (Signed)
I called to remind pt that he is due for BMP I spoke with son, Jesusita Oka, who says pt will try to come within the next 2 weeks. Says pt's wife is in hospital and they are unsure of her prognosis.

## 2012-03-26 NOTE — Telephone Encounter (Signed)
Not sure what form is needed but I can help when they have it Sorry to hear about his wife

## 2012-03-27 NOTE — Telephone Encounter (Signed)
Left message on son's VM with results

## 2012-04-01 DIAGNOSIS — J449 Chronic obstructive pulmonary disease, unspecified: Secondary | ICD-10-CM

## 2012-04-01 DIAGNOSIS — I509 Heart failure, unspecified: Secondary | ICD-10-CM

## 2012-04-01 DIAGNOSIS — I2789 Other specified pulmonary heart diseases: Secondary | ICD-10-CM

## 2012-04-01 DIAGNOSIS — I2510 Atherosclerotic heart disease of native coronary artery without angina pectoris: Secondary | ICD-10-CM

## 2012-04-04 ENCOUNTER — Ambulatory Visit (INDEPENDENT_AMBULATORY_CARE_PROVIDER_SITE_OTHER): Payer: Medicare Other | Admitting: Cardiovascular Disease

## 2012-04-04 ENCOUNTER — Encounter: Payer: Self-pay | Admitting: Internal Medicine

## 2012-04-04 ENCOUNTER — Encounter: Payer: Self-pay | Admitting: Cardiovascular Disease

## 2012-04-04 VITALS — BP 130/60 | HR 81 | Ht 67.0 in | Wt 158.8 lb

## 2012-04-04 DIAGNOSIS — I2789 Other specified pulmonary heart diseases: Secondary | ICD-10-CM

## 2012-04-04 DIAGNOSIS — F0280 Dementia in other diseases classified elsewhere without behavioral disturbance: Secondary | ICD-10-CM

## 2012-04-04 DIAGNOSIS — I259 Chronic ischemic heart disease, unspecified: Secondary | ICD-10-CM

## 2012-04-04 DIAGNOSIS — R002 Palpitations: Secondary | ICD-10-CM

## 2012-04-04 DIAGNOSIS — I272 Pulmonary hypertension, unspecified: Secondary | ICD-10-CM

## 2012-04-04 NOTE — Assessment & Plan Note (Signed)
Continue on his current medication regimen. Weight is relatively stable, no significant edema. Breathing is stable.

## 2012-04-04 NOTE — Progress Notes (Signed)
Patient ID: Bradley Holt, male    DOB: 1919-07-27, 78 y.o.   MRN: [id number]  HPI Comments: [age]-year-old gentleman with history of coronary artery disease, angioplasty of proximal RCA lesion estimated at 90% in 1994, Previous episodes of tachy palpitations and mild shortness of breath ,  admission to [facility] for chest pain after arguing with his wife, with negative stress test (lexiscan Myoview),  H/o  severe COPD,  remote smoking history, pulmonary hypertension,  presents for routine followup. admission to the hospital for malaise, shortness of breath, elevated creatinine.  He continues to be bothered by memory/recall loss of weight, possible depression, feeling frustrated about his inactivity and memory. He has lost the ability to taste food. He has had followup with neurology in the past. His wife was recently in the hospital and he had to be more active at home with more responsibility. His wife was quite surprised that he was doing more, packing the dishwasher, doing some house chores.  He did feel some palpitations walking into the clinic today.  echocardiogram showed at least moderate pulmonary hypertension with right ventricular systolic pressures of 60, normal ejection fraction, dilated inferior vena cava (study was done by the outside facility and images are not available)  He continues on oxygen, currently on at least 3.5 L. [place]  EKG shows normal sinus rhythm with rate 81 beats per minute, no significant ST or T wave changes, disease noted cholesterol has been at goal     Outpatient Encounter Prescriptions as of 04/04/2012  Medication Sig Dispense Refill  . albuterol (VENTOLIN HFA) 108 (90 BASE) MCG/ACT inhaler Inhale 2 puffs into the lungs every 6 (six) hours as needed.        Marland Kitchen aspirin 81 MG EC tablet Take 81 mg by mouth daily.        . budesonide-formoterol (SYMBICORT) 80-4.5 MCG/ACT inhaler Inhale 2 puffs into the lungs 2 (two) times daily.        . cetirizine (ZYRTEC) 10 MG  tablet Take 10 mg by mouth as needed.        . donepezil (ARICEPT) 10 MG tablet Take 1 tablet by mouth Daily.      . furosemide (LASIX) 20 MG tablet Take 1 tablet (20 mg total) by mouth daily as needed.  30 tablet  6  . losartan (COZAAR) 50 MG tablet Take 50 mg by mouth daily.        . metoprolol succinate (TOPROL-XL) 25 MG 24 hr tablet Take 1 tablet (25 mg total) by mouth 2 (two) times daily.  60 tablet  6  . NON FORMULARY Oxygen 3.5 liters daily.      . potassium chloride (K-DUR) 10 MEQ tablet Take 1 tablet (10 mEq total) by mouth daily.  30 tablet  6  . rosuvastatin (CRESTOR) 20 MG tablet Take 1 tablet (20 mg total) by mouth daily.      . sertraline (ZOLOFT) 50 MG tablet Take 50 mg by mouth daily.       . sildenafil (REVATIO) 20 MG tablet Take 1 tablet by mouth 3 (three) times daily.        Review of Systems  Constitutional: Negative.   HENT: Negative.   Eyes: Negative.   Respiratory: Positive for shortness of breath.   Gastrointestinal: Negative.   Musculoskeletal: Negative.   Skin: Negative.   Neurological: Negative.   Hematological: Negative.   Psychiatric/Behavioral:       Poor memory  All other systems reviewed and are negative.  BP 130/60  Pulse 81  Ht 5\' 7"  (1.702 m)  Wt 158 lb 12 oz (72.009 kg)  BMI 24.86 kg/m2  Physical Exam  Nursing note and vitals reviewed. Constitutional: He is oriented to person, place, and time. He appears well-developed and well-nourished.       On oxygen 3.5 L nasal cannula.  HENT:  Head: Normocephalic.  Nose: Nose normal.  Mouth/Throat: Oropharynx is clear and moist.  Eyes: Conjunctivae normal are normal. Pupils are equal, round, and reactive to light.  Neck: Normal range of motion. Neck supple. No JVD present.  Cardiovascular: Normal rate, regular rhythm, S1 normal, S2 normal, normal heart sounds and intact distal pulses.  Exam reveals no gallop and no friction rub.   No murmur heard.      Trace edema around the ankles    Pulmonary/Chest: Effort normal. No respiratory distress. He has decreased breath sounds. He has no wheezes. He has no rales. He exhibits no tenderness.  Abdominal: Soft. Bowel sounds are normal. He exhibits no distension. There is no tenderness.  Musculoskeletal: Normal range of motion. He exhibits no edema and no tenderness.  Lymphadenopathy:    He has no cervical adenopathy.  Neurological: He is alert and oriented to person, place, and time. Coordination normal.  Skin: Skin is warm and dry. No rash noted. No erythema.  Psychiatric: His behavior is normal. Judgment and thought content normal.       He appears somewhat depressed    Assessment and Plan

## 2012-04-04 NOTE — Assessment & Plan Note (Signed)
His activity seem to increase with doing more around the house while his wife was in the hospital recently. Appears relatively happy, communicating well.

## 2012-04-04 NOTE — Progress Notes (Signed)
Subjective:    Patient ID: Bradley Holt, male    DOB: [birth year], 78 y.o.   MRN: [id number]  HPI  Current Outpatient Prescriptions on File Prior to Visit  Medication Sig Dispense Refill  . albuterol (VENTOLIN HFA) 108 (90 BASE) MCG/ACT inhaler Inhale 2 puffs into the lungs every 6 (six) hours as needed.        Marland Kitchen aspirin 81 MG EC tablet Take 81 mg by mouth daily.        . budesonide-formoterol (SYMBICORT) 80-4.5 MCG/ACT inhaler Inhale 2 puffs into the lungs 2 (two) times daily.        . cetirizine (ZYRTEC) 10 MG tablet Take 10 mg by mouth as needed.        . donepezil (ARICEPT) 10 MG tablet Take 1 tablet by mouth Daily.      . furosemide (LASIX) 20 MG tablet Take 1 tablet (20 mg total) by mouth daily as needed.  30 tablet  6  . losartan (COZAAR) 50 MG tablet Take 50 mg by mouth daily.        . metoprolol succinate (TOPROL-XL) 25 MG 24 hr tablet Take 1 tablet (25 mg total) by mouth 2 (two) times daily.  60 tablet  6  . NON FORMULARY Oxygen 3.5 liters daily.      . potassium chloride (K-DUR) 10 MEQ tablet Take 1 tablet (10 mEq total) by mouth daily.  30 tablet  6  . rosuvastatin (CRESTOR) 20 MG tablet Take 1 tablet (20 mg total) by mouth daily.      . sertraline (ZOLOFT) 50 MG tablet Take 50 mg by mouth daily.       . sildenafil (REVATIO) 20 MG tablet Take 1 tablet by mouth 3 (three) times daily.         Allergies  Allergen Reactions  . Ace Inhibitors     Past Medical History  Diagnosis Date  . Hyperlipidemia   . COPD (chronic obstructive pulmonary disease)   . Hypertension   . Acute bronchitis   . CAD (coronary artery disease)   . PCI (pneumatosis cystoides intestinalis)     s/p PTCA  . Sleep apnea   . BPH (benign prostatic hypertrophy)   . Chronic anemia   . Pulmonary fibrosis   . Cognitive impairment   . Alzheimer disease   . Renal failure, chronic   . Depression     Past Surgical History  Procedure Date  . Tonsillectomy   . Cystectomy     Pienatal cyst  .  Coronary angioplasty   . Pilonidal cyst excision     Family History  Problem Relation Age of Onset  . Diabetes Sister   . Cancer Neg Hx     History   Social History  . Marital Status: Married    Spouse Name: N/A    Number of Children: 2  . Years of Education: N/A   Occupational History  . Retired     Musician   Social History Main Topics  . Smoking status: Former Smoker    Quit date: 05/23/1971  . Smokeless tobacco: Never Used  . Alcohol Use: No  . Drug Use: No  . Sexually Active: Not on file   Other Topics Concern  . Not on file   Social History Narrative   Has living willHas health care POA----wife, then childrenWill accept resuscitation attempts. No prolonged artificial ventilation.No tube feeds if cognitively unaware     Review of Systems     Objective:  Physical Exam        Assessment & Plan:

## 2012-04-04 NOTE — Assessment & Plan Note (Signed)
APCs seen on EKG. He can take beta blocker as needed, also diltiazem as needed for symptomatic palpitations.

## 2012-04-04 NOTE — Patient Instructions (Addendum)
You are doing well. No medication changes were made.  Please call us if you have new issues that need to be addressed before your next appt.  Your physician wants you to follow-up in: 6 months.  You will receive a reminder letter in the mail two months in advance. If you don't receive a letter, please call our office to schedule the follow-up appointment.   

## 2012-04-04 NOTE — Assessment & Plan Note (Signed)
Currently with no symptoms of angina. No further workup at this time. Continue current medication regimen. 

## 2012-04-14 ENCOUNTER — Ambulatory Visit: Payer: Medicare Other | Admitting: Internal Medicine

## 2012-04-14 ENCOUNTER — Other Ambulatory Visit: Payer: Self-pay

## 2012-04-14 MED ORDER — METOPROLOL SUCCINATE ER 25 MG PO TB24: 25.0000 mg | ORAL_TABLET | Freq: Two times a day (BID) | ORAL | Status: DC

## 2012-04-14 NOTE — Telephone Encounter (Signed)
Refill sent for metoprolol.  

## 2012-05-19 ENCOUNTER — Encounter: Payer: Self-pay | Admitting: Internal Medicine

## 2012-05-19 ENCOUNTER — Ambulatory Visit (INDEPENDENT_AMBULATORY_CARE_PROVIDER_SITE_OTHER): Payer: Medicare Other | Admitting: Internal Medicine

## 2012-05-19 VITALS — BP 142/70 | HR 88 | Temp 97.3°F | Wt 164.2 lb

## 2012-05-19 DIAGNOSIS — I2789 Other specified pulmonary heart diseases: Secondary | ICD-10-CM

## 2012-05-19 DIAGNOSIS — R159 Full incontinence of feces: Secondary | ICD-10-CM

## 2012-05-19 DIAGNOSIS — I272 Pulmonary hypertension, unspecified: Secondary | ICD-10-CM

## 2012-05-19 DIAGNOSIS — F0280 Dementia in other diseases classified elsewhere without behavioral disturbance: Secondary | ICD-10-CM

## 2012-05-19 NOTE — Assessment & Plan Note (Signed)
Has urgency and occ incontinence Reluctant to wear attends as he forgets why he has them No fever, weight loss, blood or other warning signs  I suspect this is related to revatio Will send note to Dr Dwaine Deter be reason to wean or stop if he is not responding

## 2012-05-19 NOTE — Progress Notes (Signed)
Subjective:    Patient ID: Bradley Holt, male    DOB: [birth year], 78 y.o.   MRN: [id number]  HPI Here with wife  Has had some stool incontinence Goes back for weeks Has to run to bathroom and can't make it in time Has depends from hospice but doesn't like wearing them May happen up to 6 times per day----though this could be for voiding too He notes some gas with no stool Normal stool ---not really liquidy No blood stool No abdominal pain Appetite is fine  Wife tried him on some acidophilus Not clearly helpful No sugarless gum or candies  Current Outpatient Prescriptions on File Prior to Visit  Medication Sig Dispense Refill  . albuterol (VENTOLIN HFA) 108 (90 BASE) MCG/ACT inhaler Inhale 2 puffs into the lungs every 6 (six) hours as needed.        Marland Kitchen aspirin 81 MG EC tablet Take 81 mg by mouth daily.        . budesonide-formoterol (SYMBICORT) 80-4.5 MCG/ACT inhaler Inhale 2 puffs into the lungs 2 (two) times daily.        . cetirizine (ZYRTEC) 10 MG tablet Take 10 mg by mouth as needed.        . diltiazem (CARDIZEM) 30 MG tablet Take 30 mg by mouth 3 (three) times daily as needed. For palpitations      . furosemide (LASIX) 20 MG tablet Take 1 tablet (20 mg total) by mouth daily as needed.  30 tablet  6  . losartan (COZAAR) 50 MG tablet Take 50 mg by mouth daily.        . metoprolol succinate (TOPROL-XL) 25 MG 24 hr tablet Take 1 tablet (25 mg total) by mouth 2 (two) times daily.  60 tablet  6  . NON FORMULARY Oxygen 3.5 liters daily.      . potassium chloride (K-DUR) 10 MEQ tablet Take 1 tablet (10 mEq total) by mouth daily.  30 tablet  6  . rosuvastatin (CRESTOR) 20 MG tablet Take 1 tablet (20 mg total) by mouth daily.      . sertraline (ZOLOFT) 50 MG tablet Take 50 mg by mouth daily.       . sildenafil (REVATIO) 20 MG tablet Take 1 tablet by mouth 3 (three) times daily.         Allergies  Allergen Reactions  . Ace Inhibitors     Past Medical History  Diagnosis Date   . Hyperlipidemia   . COPD (chronic obstructive pulmonary disease)   . Hypertension   . Acute bronchitis   . CAD (coronary artery disease)   . PCI (pneumatosis cystoides intestinalis)     s/p PTCA  . Sleep apnea   . BPH (benign prostatic hypertrophy)   . Chronic anemia   . Pulmonary fibrosis   . Cognitive impairment   . Alzheimer disease   . Renal failure, chronic   . Depression     Past Surgical History  Procedure Date  . Tonsillectomy   . Cystectomy     Pienatal cyst  . Coronary angioplasty   . Pilonidal cyst excision     Family History  Problem Relation Age of Onset  . Diabetes Sister   . Cancer Neg Hx     History   Social History  . Marital Status: Married    Spouse Name: N/A    Number of Children: 2  . Years of Education: N/A   Occupational History  . Retired     Musician  Social History Main Topics  . Smoking status: Former Smoker    Quit date: 05/23/1971  . Smokeless tobacco: Never Used  . Alcohol Use: No  . Drug Use: No  . Sexually Active: Not on file   Other Topics Concern  . Not on file   Social History Narrative   Has living willHas health care POA----wife, then childrenWill accept resuscitation attempts. No prolonged artificial ventilation.No tube feeds if cognitively unawareMOST form done via hospice 10/25/13Would accept resuscitationComfort measures if pulse but poor statusNo antibiotics or IV fluids   Review of Systems Weight up a few pounds Still has decreased sensation of taste    Objective:   Physical Exam  Constitutional: He appears well-developed and well-nourished. No distress.  Neck: Normal range of motion. Neck supple.  Cardiovascular: Normal rate, regular rhythm and normal heart sounds.  Exam reveals no gallop.   No murmur heard. Pulmonary/Chest: Effort normal and breath sounds normal. No respiratory distress. He has no wheezes. He has no rales.  Abdominal: Soft. Bowel sounds are normal. He exhibits  no distension and no mass. There is no tenderness. There is no rebound and no guarding.  Musculoskeletal: He exhibits no edema.  Lymphadenopathy:    He has no cervical adenopathy.          Assessment & Plan:

## 2012-05-19 NOTE — Progress Notes (Signed)
Subjective:    Patient ID: Bradley Holt, male    DOB: Jul 23, 1919, 78 y.o.   MRN: [id number]  HPI Current Outpatient Prescriptions on File Prior to Visit  Medication Sig Dispense Refill  . albuterol (VENTOLIN HFA) 108 (90 BASE) MCG/ACT inhaler Inhale 2 puffs into the lungs every 6 (six) hours as needed.        Marland Kitchen aspirin 81 MG EC tablet Take 81 mg by mouth daily.        . budesonide-formoterol (SYMBICORT) 80-4.5 MCG/ACT inhaler Inhale 2 puffs into the lungs 2 (two) times daily.        . cetirizine (ZYRTEC) 10 MG tablet Take 10 mg by mouth as needed.        . furosemide (LASIX) 20 MG tablet Take 1 tablet (20 mg total) by mouth daily as needed.  30 tablet  6  . losartan (COZAAR) 50 MG tablet Take 50 mg by mouth daily.        . NON FORMULARY Oxygen 3.5 liters daily.      . potassium chloride (K-DUR) 10 MEQ tablet Take 1 tablet (10 mEq total) by mouth daily.  30 tablet  6  . rosuvastatin (CRESTOR) 20 MG tablet Take 1 tablet (20 mg total) by mouth daily.      . sertraline (ZOLOFT) 50 MG tablet Take 50 mg by mouth daily.       . sildenafil (REVATIO) 20 MG tablet Take 1 tablet by mouth 3 (three) times daily.       . metoprolol succinate (TOPROL-XL) 25 MG 24 hr tablet Take 1 tablet (25 mg total) by mouth 2 (two) times daily.  60 tablet  6    Allergies  Allergen Reactions  . Ace Inhibitors     Past Medical History  Diagnosis Date  . Hyperlipidemia   . COPD (chronic obstructive pulmonary disease)   . Hypertension   . Acute bronchitis   . CAD (coronary artery disease)   . PCI (pneumatosis cystoides intestinalis)     s/p PTCA  . Sleep apnea   . BPH (benign prostatic hypertrophy)   . Chronic anemia   . Pulmonary fibrosis   . Cognitive impairment   . Alzheimer disease   . Renal failure, chronic   . Depression     Past Surgical History  Procedure Date  . Tonsillectomy   . Cystectomy     Pienatal cyst  . Coronary angioplasty   . Pilonidal cyst excision     Family History   Problem Relation Age of Onset  . Diabetes Sister   . Cancer Neg Hx     History   Social History  . Marital Status: Married    Spouse Name: N/A    Number of Children: 2  . Years of Education: N/A   Occupational History  . Retired     Musician   Social History Main Topics  . Smoking status: Former Smoker    Quit date: 05/23/1971  . Smokeless tobacco: Never Used  . Alcohol Use: No  . Drug Use: No  . Sexually Active: Not on file   Other Topics Concern  . Not on file   Social History Narrative   Has living willHas health care POA----wife, then childrenWill accept resuscitation attempts. No prolonged artificial ventilation.No tube feeds if cognitively unawareMOST form done via hospice 10/25/13Would accept resuscitationComfort measures if pulse but poor statusNo antibiotics or IV fluids      Review of Systems     Objective:  Physical Exam        Assessment & Plan:

## 2012-05-19 NOTE — Assessment & Plan Note (Signed)
Ongoing memory issues Wife sees slow progression He has limited insight and judgement

## 2012-05-19 NOTE — Assessment & Plan Note (Signed)
Wife hasn't really seen any improvement on the sildenafil Still very limited exercise tolerance---dyspnea even walking in the house May want to reconsider the revatio--will refer to Dr Welton Flakes

## 2012-06-12 ENCOUNTER — Telehealth: Payer: Self-pay

## 2012-06-12 NOTE — Telephone Encounter (Signed)
Pt's wife left v/m; pt seen 05/19/12; pt has not heard if pt needs to take sildenafil; is pt to be referred to Dr Welton Flakes? Please advise.

## 2012-06-14 NOTE — Telephone Encounter (Signed)
Let her know that Dr Welton Flakes was to make that decision I sent my note and I hoped her would let them know  They should call his office for instructions about whether to continue the med

## 2012-06-16 ENCOUNTER — Ambulatory Visit (INDEPENDENT_AMBULATORY_CARE_PROVIDER_SITE_OTHER): Payer: Medicare Other | Admitting: Internal Medicine

## 2012-06-16 ENCOUNTER — Encounter: Payer: Self-pay | Admitting: Internal Medicine

## 2012-06-16 VITALS — BP 150/70 | HR 68 | Temp 97.8°F | Wt 157.0 lb

## 2012-06-16 DIAGNOSIS — I2789 Other specified pulmonary heart diseases: Secondary | ICD-10-CM

## 2012-06-16 DIAGNOSIS — I10 Essential (primary) hypertension: Secondary | ICD-10-CM

## 2012-06-16 DIAGNOSIS — I272 Pulmonary hypertension, unspecified: Secondary | ICD-10-CM

## 2012-06-16 DIAGNOSIS — F0280 Dementia in other diseases classified elsewhere without behavioral disturbance: Secondary | ICD-10-CM

## 2012-06-16 MED ORDER — ALBUTEROL SULFATE HFA 108 (90 BASE) MCG/ACT IN AERS: 2.0000 | INHALATION_SPRAY | Freq: Four times a day (QID) | RESPIRATORY_TRACT | Status: AC | PRN

## 2012-06-16 NOTE — Progress Notes (Signed)
Subjective:    Patient ID: Bradley Holt, male    DOB: 1920/03/22, 79 y.o.   MRN: [id number]  HPI Here with wife Still on the revatio The bowel incontinence seems better now  Last visit with Dr Santo Held office was with PA Had echo that looked "very good" per wife Not clear if they thought the med was helping  Breathing has been okay With any activity, he gets dyspnea--like walking to bathroom, or mailbox.  No problems brushing teeth  Bothered by his cognitive issues Frustrated---"I can't do the things I used to do" Will need wife to give instruction about how to get ready for bed (like needs to know to take off clothes, get in pajamas, etc) Mood is not great Generally anhedonic-but still enjoys football game, etc  Current Outpatient Prescriptions on File Prior to Visit  Medication Sig Dispense Refill  . albuterol (VENTOLIN HFA) 108 (90 BASE) MCG/ACT inhaler Inhale 2 puffs into the lungs every 6 (six) hours as needed.        Marland Kitchen aspirin 81 MG EC tablet Take 81 mg by mouth daily.        . budesonide-formoterol (SYMBICORT) 80-4.5 MCG/ACT inhaler Inhale 2 puffs into the lungs 2 (two) times daily.        . cetirizine (ZYRTEC) 10 MG tablet Take 10 mg by mouth as needed.        . diltiazem (CARDIZEM) 30 MG tablet Take 30 mg by mouth 3 (three) times daily as needed. For palpitations      . furosemide (LASIX) 20 MG tablet Take 1 tablet (20 mg total) by mouth daily as needed.  30 tablet  6  . losartan (COZAAR) 50 MG tablet Take 50 mg by mouth daily.        . metoprolol succinate (TOPROL-XL) 25 MG 24 hr tablet Take 1 tablet (25 mg total) by mouth 2 (two) times daily.  60 tablet  6  . NON FORMULARY Oxygen 3.5 liters daily.      . potassium chloride (K-DUR) 10 MEQ tablet Take 1 tablet (10 mEq total) by mouth daily.  30 tablet  6  . rosuvastatin (CRESTOR) 20 MG tablet Take 1 tablet (20 mg total) by mouth daily.      . sertraline (ZOLOFT) 50 MG tablet Take 50 mg by mouth daily.       .  sildenafil (REVATIO) 20 MG tablet Take 1 tablet by mouth 3 (three) times daily.         Allergies  Allergen Reactions  . Ace Inhibitors     Past Medical History  Diagnosis Date  . Hyperlipidemia   . COPD (chronic obstructive pulmonary disease)   . Hypertension   . Acute bronchitis   . CAD (coronary artery disease)   . PCI (pneumatosis cystoides intestinalis)     s/p PTCA  . Sleep apnea   . BPH (benign prostatic hypertrophy)   . Chronic anemia   . Pulmonary fibrosis   . Cognitive impairment   . Alzheimer disease   . Renal failure, chronic   . Depression     Past Surgical History  Procedure Date  . Tonsillectomy   . Cystectomy     Pienatal cyst  . Coronary angioplasty   . Pilonidal cyst excision     Family History  Problem Relation Age of Onset  . Diabetes Sister   . Cancer Neg Hx     History   Social History  . Marital Status: Married    Spouse  Name: N/A    Number of Children: 2  . Years of Education: N/A   Occupational History  . Retired     Musician   Social History Main Topics  . Smoking status: Former Smoker    Quit date: 05/23/1971  . Smokeless tobacco: Never Used  . Alcohol Use: No  . Drug Use: No  . Sexually Active: Not on file   Other Topics Concern  . Not on file   Social History Narrative   Has living willHas health care POA----wife, then childrenWill accept resuscitation attempts. No prolonged artificial ventilation.No tube feeds if cognitively unawareMOST form done via hospice 10/25/13Would accept resuscitationComfort measures if pulse but poor statusNo antibiotics or IV fluids   Review of Systems Appetite is fine Weight has had slow decline over time Sleep is generally okay--wife notes that he can be restless at times    Objective:   Physical Exam  Constitutional: He appears well-developed and well-nourished. No distress.  Neck: Normal range of motion.  Cardiovascular: Normal rate, regular rhythm and  normal heart sounds.  Exam reveals no gallop.   No murmur heard. Pulmonary/Chest: Effort normal and breath sounds normal. No respiratory distress. He has no wheezes. He has no rales.  Musculoskeletal: He exhibits no edema and no tenderness.  Lymphadenopathy:    He has no cervical adenopathy.  Psychiatric:       Appropriate conversation Did laugh at some of the comments---still some sense of humor          Assessment & Plan:

## 2012-06-16 NOTE — Telephone Encounter (Signed)
Spoke with patient's wife and advised results, wife stated she will call Dr.Khans office

## 2012-06-16 NOTE — Assessment & Plan Note (Signed)
Still mild Has moderate functional needs ---may be more related to his dyspnea Wife asks about vitamin E---I would not recommend it yet

## 2012-06-16 NOTE — Assessment & Plan Note (Signed)
Hospice diagnosis No longer having the stool incontinence Will continue till Dr Welton Flakes can comment on it---not clearly better

## 2012-06-16 NOTE — Assessment & Plan Note (Signed)
BP Readings from Last 3 Encounters:  06/16/12 150/70  05/19/12 142/70  04/04/12 130/60   Somewhat up but will not increase meds since on revatio Hospice nurse notes BPs of 120/59, 144/72, 110/59, 136.62, 106/52

## 2012-06-24 NOTE — Telephone Encounter (Signed)
pts wife said has tried x 2 to reach Dr Welton Flakes; med cost $1400/month for that prescription.Pt's wife wanted Dr Alphonsus Sias to know that due to cost of sildenafil pt is not taking med.pts wife will call Dr Milta Deiters office back.

## 2012-06-24 NOTE — Telephone Encounter (Signed)
Spoke with patient's wife and advised results, she wanted to know if there is a substitute for that medication? Or something comparable?

## 2012-06-24 NOTE — Telephone Encounter (Signed)
Let her know if he couldn't tell a difference with his breathing, it was okay to stop the med

## 2012-06-24 NOTE — Telephone Encounter (Signed)
Spoke with patient's wife and advised results  

## 2012-06-24 NOTE — Telephone Encounter (Signed)
There is nothing in that class that would substitute for it They probably need to discuss other options with Dr Welton Flakes (sometimes prednisone is used in fairly high doses)

## 2012-07-01 DIAGNOSIS — I2510 Atherosclerotic heart disease of native coronary artery without angina pectoris: Secondary | ICD-10-CM

## 2012-07-01 DIAGNOSIS — I509 Heart failure, unspecified: Secondary | ICD-10-CM

## 2012-07-01 DIAGNOSIS — I2789 Other specified pulmonary heart diseases: Secondary | ICD-10-CM

## 2012-07-01 DIAGNOSIS — J449 Chronic obstructive pulmonary disease, unspecified: Secondary | ICD-10-CM

## 2012-07-19 ENCOUNTER — Emergency Department: Payer: Self-pay | Admitting: Emergency Medicine

## 2012-07-19 LAB — COMPREHENSIVE METABOLIC PANEL
Albumin: 3.9 g/dL (ref 3.4–5.0)
Alkaline Phosphatase: 107 U/L (ref 50–136)
Anion Gap: 9 (ref 7–16)
BUN: 21 mg/dL — ABNORMAL HIGH (ref 7–18)
Creatinine: 1.76 mg/dL — ABNORMAL HIGH (ref 0.60–1.30)
EGFR (African American): 39 — ABNORMAL LOW
EGFR (Non-African Amer.): 33 — ABNORMAL LOW
Glucose: 86 mg/dL (ref 65–99)
Potassium: 4 mmol/L (ref 3.5–5.1)
SGPT (ALT): 17 U/L (ref 12–78)

## 2012-07-19 LAB — URINALYSIS, COMPLETE
Bilirubin,UR: NEGATIVE
Blood: NEGATIVE
Ketone: NEGATIVE
Leukocyte Esterase: NEGATIVE
Nitrite: NEGATIVE
Ph: 6 (ref 4.5–8.0)
Protein: 75
Squamous Epithelial: <1
WBC UR: 11 /HPF (ref 0–5)

## 2012-07-19 LAB — TROPONIN I
Troponin-I: 0.05 ng/mL
Troponin-I: 0.08 ng/mL — ABNORMAL HIGH

## 2012-07-19 LAB — CBC
HCT: 45.5 % (ref 40.0–52.0)
HGB: 14.9 g/dL (ref 13.0–18.0)
MCV: 96 fL (ref 80–100)
RBC: 4.76 10*6/uL (ref 4.40–5.90)
RDW: 14.5 % (ref 11.5–14.5)
WBC: 7.5 10*3/uL (ref 3.8–10.6)

## 2012-08-25 DIAGNOSIS — I509 Heart failure, unspecified: Secondary | ICD-10-CM

## 2012-08-25 DIAGNOSIS — I2789 Other specified pulmonary heart diseases: Secondary | ICD-10-CM

## 2012-08-25 DIAGNOSIS — J449 Chronic obstructive pulmonary disease, unspecified: Secondary | ICD-10-CM

## 2012-08-25 DIAGNOSIS — I2510 Atherosclerotic heart disease of native coronary artery without angina pectoris: Secondary | ICD-10-CM

## 2012-09-05 ENCOUNTER — Telehealth: Payer: Self-pay

## 2012-09-05 NOTE — Telephone Encounter (Signed)
Spoke with patient's wife and advised results All OV canceled

## 2012-09-05 NOTE — Telephone Encounter (Signed)
Please let her know that I will plan to make a home visit next week---probably Thursday before lunch I will call her with a specific time next week

## 2012-09-05 NOTE — Telephone Encounter (Signed)
Mrs Gillie said pt usually comes to office for appt but pt is not able to come to office for appt. Pt's dementia is worse and pt gets SOB even with oxygen on when moves around. Mrs Colaizzi would like Dr Alphonsus Sias to see pt at [facility] in the independent living section. Mrs Grauberger request call back.

## 2012-09-05 NOTE — Telephone Encounter (Signed)
Cancel any OVs he may have

## 2012-09-11 ENCOUNTER — Encounter: Payer: Self-pay | Admitting: Internal Medicine

## 2012-09-11 ENCOUNTER — Ambulatory Visit: Payer: Medicare Other | Admitting: Internal Medicine

## 2012-09-11 VITALS — BP 110/64 | HR 76 | Resp 20 | Wt 152.8 lb

## 2012-09-11 DIAGNOSIS — J449 Chronic obstructive pulmonary disease, unspecified: Secondary | ICD-10-CM

## 2012-09-11 DIAGNOSIS — I2510 Atherosclerotic heart disease of native coronary artery without angina pectoris: Secondary | ICD-10-CM

## 2012-09-11 DIAGNOSIS — G309 Alzheimer's disease, unspecified: Secondary | ICD-10-CM

## 2012-09-11 DIAGNOSIS — I2789 Other specified pulmonary heart diseases: Secondary | ICD-10-CM

## 2012-09-11 DIAGNOSIS — I10 Essential (primary) hypertension: Secondary | ICD-10-CM

## 2012-09-11 DIAGNOSIS — I272 Pulmonary hypertension, unspecified: Secondary | ICD-10-CM

## 2012-09-11 DIAGNOSIS — F0280 Dementia in other diseases classified elsewhere without behavioral disturbance: Secondary | ICD-10-CM

## 2012-09-11 DIAGNOSIS — F329 Major depressive disorder, single episode, unspecified: Secondary | ICD-10-CM

## 2012-09-11 NOTE — Assessment & Plan Note (Signed)
Marked restriction of activites Equivalent of NYHA class 3 or worse at times On oxygen revatio didn't help On hospice care

## 2012-09-11 NOTE — Progress Notes (Signed)
Subjective:    Patient ID: Bradley Holt, male    DOB: 06-03-1920, 79 y.o.   MRN: [id number]  HPI First home visit Candace--hospice social worker and Production assistant, radio are here  Wife is conflicted Has had a hard time taking care of him Feels guilty for thinking about placement It affects her that he asks the same things over and over---needs direction for even the littlest things  Still gets DOE with minimal exertion---just walking in house Can dress with stand by Hospice CNA helping with shower Uses the bathroom independently--but will have occasional bowel and bladder incontinence if he isn't able to get there in time Off the revatio Hasn't gone back to Dr Welton Flakes   Is frustrated by limitations Not otherwise not depressed Does seem anhedonic though Wife feels cooped up here---hasn't gotten out  Current Outpatient Prescriptions on File Prior to Visit  Medication Sig Dispense Refill  . albuterol (PROVENTIL HFA;VENTOLIN HFA) 108 (90 BASE) MCG/ACT inhaler Inhale 2 puffs into the lungs every 6 (six) hours as needed for wheezing.  18 g  3  . aspirin 81 MG EC tablet Take 81 mg by mouth daily.        . budesonide-formoterol (SYMBICORT) 80-4.5 MCG/ACT inhaler Inhale 2 puffs into the lungs 2 (two) times daily.        . cetirizine (ZYRTEC) 10 MG tablet Take 10 mg by mouth as needed.        . diltiazem (CARDIZEM) 30 MG tablet Take 30 mg by mouth 3 (three) times daily as needed. For palpitations      . furosemide (LASIX) 20 MG tablet Take 1 tablet (20 mg total) by mouth daily as needed.  30 tablet  6  . losartan (COZAAR) 50 MG tablet Take 50 mg by mouth daily.        . metoprolol succinate (TOPROL-XL) 25 MG 24 hr tablet Take 25 mg by mouth daily.      . NON FORMULARY Oxygen 3.5 liters daily.      . potassium chloride (K-DUR) 10 MEQ tablet Take 1 tablet (10 mEq total) by mouth daily.  30 tablet  6  . rosuvastatin (CRESTOR) 20 MG tablet Take 1 tablet (20 mg total) by mouth daily.      .  sertraline (ZOLOFT) 50 MG tablet Take 50 mg by mouth daily.       . sildenafil (REVATIO) 20 MG tablet Take 1 tablet by mouth 3 (three) times daily.        No current facility-administered medications on file prior to visit.    Allergies  Allergen Reactions  . Ace Inhibitors     Past Medical History  Diagnosis Date  . Hyperlipidemia   . COPD (chronic obstructive pulmonary disease)   . Hypertension   . Acute bronchitis   . CAD (coronary artery disease)   . PCI (pneumatosis cystoides intestinalis)     s/p PTCA  . Sleep apnea   . BPH (benign prostatic hypertrophy)   . Chronic anemia   . Pulmonary fibrosis   . Cognitive impairment   . Alzheimer disease   . Renal failure, chronic   . Depression     Past Surgical History  Procedure Laterality Date  . Tonsillectomy    . Cystectomy      Pienatal cyst  . Coronary angioplasty    . Pilonidal cyst excision      Family History  Problem Relation Age of Onset  . Diabetes Sister   . Cancer Neg Hx  History   Social History  . Marital Status: Married    Spouse Name: N/A    Number of Children: 2  . Years of Education: N/A   Occupational History  . Retired     Musician   Social History Main Topics  . Smoking status: Former Smoker    Quit date: 05/23/1971  . Smokeless tobacco: Never Used  . Alcohol Use: No  . Drug Use: No  . Sexually Active: Not on file   Other Topics Concern  . Not on file   Social History Narrative   Has living will   Has health care POA----wife, then children   Will accept resuscitation attempts. No prolonged artificial ventilation.   No tube feeds if cognitively unaware         MOST form done via hospice 04/04/12   Would accept resuscitation   Comfort measures if pulse but poor status   No antibiotics or IV fluids   Review of Systems He does sleep well when he finally goes to sleep--may actually sleep too much CPAP machine is a burden--hard to tell if it is  helping Has continued to lose weight--not eating great    Objective:   Physical Exam  Constitutional: He appears well-developed and well-nourished. No distress.  Neck: Normal range of motion. Neck supple.  Cardiovascular: Normal rate, regular rhythm and normal heart sounds.  Exam reveals no gallop.   No murmur heard. Pulmonary/Chest: He is in respiratory distress. He has no wheezes. He has rales.  Early inspiratory crackles at bases  Musculoskeletal: He exhibits no edema and no tenderness.  Lymphadenopathy:    He has no cervical adenopathy.  Psychiatric:  Depressed some psychomotor retardation          Assessment & Plan:

## 2012-09-11 NOTE — Assessment & Plan Note (Signed)
BP Readings from Last 3 Encounters:  09/11/12 110/64  06/16/12 150/70  05/19/12 142/70   Will stop the metoprolol

## 2012-09-11 NOTE — Assessment & Plan Note (Signed)
Will continue the inhalers

## 2012-09-11 NOTE — Assessment & Plan Note (Signed)
Has been quiet Will stop the statin given our palliative focus

## 2012-09-11 NOTE — Assessment & Plan Note (Signed)
Still relatively mild but a real frustration for his wife Hospice will work on supporting her more No Rx

## 2012-09-11 NOTE — Assessment & Plan Note (Signed)
Seems to be worse Will increase the sertraline to 100mg  daily RN will monitor

## 2012-09-15 ENCOUNTER — Ambulatory Visit: Payer: Medicare Other | Admitting: Internal Medicine

## 2012-09-17 ENCOUNTER — Encounter: Payer: Self-pay | Admitting: Internal Medicine

## 2012-10-02 ENCOUNTER — Inpatient Hospital Stay: Payer: Self-pay | Admitting: Internal Medicine

## 2012-10-02 DIAGNOSIS — R0602 Shortness of breath: Secondary | ICD-10-CM

## 2012-10-02 DIAGNOSIS — I369 Nonrheumatic tricuspid valve disorder, unspecified: Secondary | ICD-10-CM

## 2012-10-02 DIAGNOSIS — I2510 Atherosclerotic heart disease of native coronary artery without angina pectoris: Secondary | ICD-10-CM

## 2012-10-02 DIAGNOSIS — R002 Palpitations: Secondary | ICD-10-CM

## 2012-10-02 LAB — TROPONIN I
Troponin-I: <0.02 ng/mL
Troponin-I: <0.02 ng/mL
Troponin-I: 0.02 ng/mL

## 2012-10-02 LAB — BASIC METABOLIC PANEL
Anion Gap: 9 (ref 7–16)
BUN: 34 mg/dL — ABNORMAL HIGH (ref 7–18)
Chloride: 107 mmol/L (ref 98–107)
Co2: 22 mmol/L (ref 21–32)
Creatinine: 2.03 mg/dL — ABNORMAL HIGH (ref 0.60–1.30)
EGFR (African American): 32 — ABNORMAL LOW
EGFR (Non-African Amer.): 28 — ABNORMAL LOW

## 2012-10-02 LAB — URINALYSIS, COMPLETE
Bilirubin,UR: NEGATIVE
Hyaline Cast: 22
Ph: 5 (ref 4.5–8.0)
RBC,UR: 2 /HPF (ref 0–5)
Specific Gravity: 1.021 (ref 1.003–1.030)
Squamous Epithelial: <1

## 2012-10-02 LAB — CBC
HGB: 14.6 g/dL (ref 13.0–18.0)
MCHC: 32.2 g/dL (ref 32.0–36.0)
RDW: 16.6 % — ABNORMAL HIGH (ref 11.5–14.5)

## 2012-10-02 LAB — CK TOTAL AND CKMB (NOT AT ARMC)
CK, Total: 31 U/L — ABNORMAL LOW (ref 35–232)
CK-MB: <0.5 ng/mL — ABNORMAL LOW (ref 0.5–3.6)
CK-MB: 0.5 ng/mL (ref 0.5–3.6)
CK-MB: 1 ng/mL (ref 0.5–3.6)

## 2012-10-02 LAB — POTASSIUM: Potassium: 4.1 mmol/L (ref 3.5–5.1)

## 2012-10-03 DIAGNOSIS — I509 Heart failure, unspecified: Secondary | ICD-10-CM

## 2012-10-03 LAB — CBC WITH DIFFERENTIAL/PLATELET
Basophil %: 1.1 %
Eosinophil %: 2.2 %
HCT: 40.6 % (ref 40.0–52.0)
HGB: 13.5 g/dL (ref 13.0–18.0)
Lymphocyte #: 0.8 10*3/uL — ABNORMAL LOW (ref 1.0–3.6)
Monocyte #: 0.8 x10 3/mm (ref 0.2–1.0)
Monocyte %: 9.4 %
Neutrophil #: 6.4 10*3/uL (ref 1.4–6.5)
Neutrophil %: 77.1 %
Platelet: 142 10*3/uL — ABNORMAL LOW (ref 150–440)

## 2012-10-03 LAB — BASIC METABOLIC PANEL
BUN: 39 mg/dL — ABNORMAL HIGH (ref 7–18)
Chloride: 103 mmol/L (ref 98–107)
Co2: 30 mmol/L (ref 21–32)
Creatinine: 2.14 mg/dL — ABNORMAL HIGH (ref 0.60–1.30)
EGFR (African American): 30 — ABNORMAL LOW
Glucose: 87 mg/dL (ref 65–99)
Osmolality: 292 (ref 275–301)
Sodium: 142 mmol/L (ref 136–145)

## 2012-10-04 LAB — BASIC METABOLIC PANEL
BUN: 42 mg/dL — ABNORMAL HIGH (ref 7–18)
Calcium, Total: 8.5 mg/dL (ref 8.5–10.1)
Chloride: 101 mmol/L (ref 98–107)
Co2: 32 mmol/L (ref 21–32)
EGFR (Non-African Amer.): 26 — ABNORMAL LOW
Sodium: 140 mmol/L (ref 136–145)

## 2012-10-06 ENCOUNTER — Telehealth: Payer: Self-pay | Admitting: *Deleted

## 2012-10-06 ENCOUNTER — Telehealth: Payer: Self-pay

## 2012-10-06 LAB — BASIC METABOLIC PANEL WITH GFR
Anion Gap: 7
BUN: 37 mg/dL — ABNORMAL HIGH
Calcium, Total: 8.5 mg/dL
Chloride: 101 mmol/L
Co2: 30 mmol/L
Creatinine: 1.69 mg/dL — ABNORMAL HIGH
EGFR (African American): 41 — ABNORMAL LOW
EGFR (Non-African Amer.): 35 — ABNORMAL LOW
Glucose: 132 mg/dL — ABNORMAL HIGH
Osmolality: 286
Potassium: 3.8 mmol/L
Sodium: 138 mmol/L

## 2012-10-06 NOTE — Telephone Encounter (Signed)
Message copied by [facility], Kimberlea Schlag E on Mon Oct 06, 2012  2:47 PM ------      Message from: Thersa Salt      Created: Mon Oct 06, 2012  2:35 PM      Regarding: TCM       PT WAS D/C TODAY FOR [facility]. Already had an appt for 4/30 i changed to tcm. Gollan nor Alinda Money had anything within a weeks ------

## 2012-10-06 NOTE — Telephone Encounter (Signed)
TCM  

## 2012-10-06 NOTE — Telephone Encounter (Signed)
[facility] called to make a hospital follow-up visit, will you be seeing patient at home again?

## 2012-10-06 NOTE — Telephone Encounter (Signed)
He is going to health care and will be staying there I am seeing him tomorrow afternoon at [facility]

## 2012-10-07 DIAGNOSIS — I5023 Acute on chronic systolic (congestive) heart failure: Secondary | ICD-10-CM

## 2012-10-07 DIAGNOSIS — F0280 Dementia in other diseases classified elsewhere without behavioral disturbance: Secondary | ICD-10-CM

## 2012-10-07 DIAGNOSIS — G309 Alzheimer's disease, unspecified: Secondary | ICD-10-CM

## 2012-10-07 DIAGNOSIS — F329 Major depressive disorder, single episode, unspecified: Secondary | ICD-10-CM

## 2012-10-07 DIAGNOSIS — I2789 Other specified pulmonary heart diseases: Secondary | ICD-10-CM

## 2012-10-07 NOTE — Telephone Encounter (Signed)
tcm # [id number]

## 2012-10-08 ENCOUNTER — Encounter: Payer: Medicare Other | Admitting: Cardiovascular Disease

## 2012-10-08 NOTE — Telephone Encounter (Signed)
LMTCB TCM attempt #2

## 2012-10-16 DIAGNOSIS — I5022 Chronic systolic (congestive) heart failure: Secondary | ICD-10-CM

## 2012-10-16 DIAGNOSIS — G309 Alzheimer's disease, unspecified: Secondary | ICD-10-CM

## 2012-10-16 DIAGNOSIS — F0280 Dementia in other diseases classified elsewhere without behavioral disturbance: Secondary | ICD-10-CM

## 2012-10-16 DIAGNOSIS — I270 Primary pulmonary hypertension: Secondary | ICD-10-CM

## 2012-10-16 DIAGNOSIS — F329 Major depressive disorder, single episode, unspecified: Secondary | ICD-10-CM

## 2012-11-20 DIAGNOSIS — F411 Generalized anxiety disorder: Secondary | ICD-10-CM

## 2012-11-20 DIAGNOSIS — G309 Alzheimer's disease, unspecified: Secondary | ICD-10-CM

## 2012-11-20 DIAGNOSIS — F0280 Dementia in other diseases classified elsewhere without behavioral disturbance: Secondary | ICD-10-CM

## 2012-11-20 DIAGNOSIS — I270 Primary pulmonary hypertension: Secondary | ICD-10-CM

## 2012-11-20 DIAGNOSIS — I5022 Chronic systolic (congestive) heart failure: Secondary | ICD-10-CM

## 2012-12-09 DEATH — deceased

## 2014-10-01 NOTE — H&P (Signed)
PATIENT NAME:  Bradley Holt, Bradley Holt MR#:  [id number] DATE OF BIRTH:  1920-04-02  DATE OF ADMISSION:  10/02/2012  REFERRING PHYSICIAN: Enedina Finner. Manson Passey, M.D.   PRIMARY CARE PHYSICIAN:  Tillman Abide, MD. The patient started to follow with Dr. Alphonsus Sias for the last few months.  PRIMARY CARDIOLOGY:  Julien Nordmann, MD from [facility].   HISTORY OF PRESENT ILLNESS: This is 81 year old male with history of chronic respiratory failure, baseline oxygen currently at 5 liters nasal cannula, known history of pulmonary fibrosis and COPD and pulmonary hypertension, with known right heart systolic congestive heart failure.  The patient on hospice care at home, baseline 5 liters nasal cannula, who presents with worsening fatigue, weakness and shortness of breath. He reports shortness of breath mainly exertional, as well reports having 2 pillows when he sees.  The patient is an extremely poor historian secondary to his dementia. Most of the history was given from the wife, who is at bedside, who lives with him.  She reports her he has intermittent shortness of breath over the last couple of weeks but it has become more constant over the last 2 days. As well, he had significant worsening lower extremity edema as well. She denies any fever, chills, cough or productive sputum or significant wheezing for the patient.  As well, she reports he has been compliant with his medication.  He has been followed by hospice at home as well. In the ED, the patient was found to be hypoxic on room air at 86% on room air, but he is at 5 liters at baseline at home. The patient had significantly elevated pro-BNP at 23,000. The patient is known to have history of chronic kidney disease with baseline creatinine around 1.7, his creatinine today was at 2. Wife reports that her husband's metoprolol and statin were recently stopped about his PCP and she cannot recall why. The patient had an ABG done in the ED which did show a pO2 of 54.  Hospitalist service was requested to admit the patient for acute CHF.   PAST MEDICAL HISTORY: 1.  Hypertension.  2.  Chronic obstructive pulmonary disease.  3.  Pulmonary fibrosis with pulmonary hypertension.  4.  Chronic respiratory failure on 5 liters nasal cannula.  5.  Right heart systolic congestive heart failure.  6.  Coronary artery disease.  7.  Anemia.  8.  Chronic kidney disease stage III.  9.  Hyperlipidemia.  10.  Benign prostatic hypertrophy.  11.  Sleep apnea.  12.  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   ALLERGIES: ACE INHIBITOR causing cough.   HOME MEDICATIONS: 1.  Aspirin 81 mg daily.  2.  Symbicort 2 puffs 2 times a day.  3.  Diltiazem 30 mg 3 times a day as needed for palpitation.  4.  Lasix 20 mg every other day.  5.  Losartan 50 mg oral daily.  6.  Potassium chloride 10 mEq 3 times a week.  7.  Sertraline 100 mg oral daily.  8.  Albuterol 2 puffs as needed every 6 hours.  9.  Lorazepam 0.5 mg at bedtime as needed.  10.  Recently was instructed by PCP to stop metoprolol extended release and Crestor.   SOCIAL HISTORY: Currently the patient lives at home with his wife. He is on hospice care.  History of remote tobacco abuse. No alcohol or illicit drug use.   FAMILY HISTORY: Significant for diabetes and gout.   REVIEW OF SYSTEMS: CONSTITUTIONAL: The patient has dementia, cannot give any reliable  review of systems but he denies any fever or chills. Complains of fatigue and weakness.  EYES: Denies blurry vision, double vision, pain, inflammation.  ENT: Denies tinnitus, ear pain, hearing loss or epistaxis.  RESPIRATORY: Denies any cough, wheezing, hemoptysis. Complains of shortness of breath.  CARDIOVASCULAR: Denies chest pain, arrhythmia, palpitations, syncope. Has worsening lower extremity edema.  GASTROINTESTINAL: Denies nausea, vomiting, diarrhea, abdominal pain, hematemesis.  GENITOURINARY: Denies dysuria, hematuria, renal colic. ENDOCRINE:  Denies  polyuria, polydipsia, heat or cold intolerance.  HEMATOLOGY: Denies anemia, easy bruising, bleeding diathesis.   INTEGUMENTARY: Denies acne, rash or skin lesions.  MUSCULOSKELETAL: Denies any cramps, gout. Has limited activity.  NEUROLOGIC: Denies any epilepsy, focal deficits, ataxia.  As per wife, he has dementia.  PSYCHIATRIC: Denies any anxiety, insomnia, nervousness or schizophrenia.   PHYSICAL EXAMINATION: VITAL SIGNS: Pulse 91, respiratory rate 18, blood pressure 144/83, saturating 97% on 5 liters nasal cannula.  GENERAL: Frail, elderly male, looks comfortable in bed in no apparent distress.  HEENT: Head is atraumatic, normocephalic. Pupils equal, reactive to light. Pink conjunctivae. Anicteric sclerae. Moist oral mucosa.  NECK: Supple. No thyromegaly. No JVD.  CHEST: Had good air entry bilaterally. No wheezing, rales or rhonchi.  CARDIOVASCULAR: S1, S2 heard. No rubs or gallops.  ABDOMEN: Soft, nontender, nondistended. Bowel sounds present.  EXTREMITIES: He has +2 edema bilaterally. No clubbing. No cyanosis.  PSYCHIATRIC: Pleasant, awake, alert x 2.  NEUROLOGIC: Cranial nerves grossly intact. Motor 5/5. SKIN: Normal skin turgor. Warm and dry.   PERTINENT LABORATORY AND DIAGNOSTIC DATA:  Glucose 104, pro-BNP 23,000, BUN 34, creatinine 2.03, sodium 138, potassium 5.7, chloride 107, CO2 22. Troponin less than 0.02. White blood cells 7.4, hemoglobin 14.6, hematocrit 45.3, platelets 135. Urinalysis showing trace leukocyte esterase with 6 white blood cells. ABG is showing pH of 7.43, pCO2 of 35, pO2 of 54. EKG showing normal sinus rhythm at 90 beats without significant ST or T wave changes.   ASSESSMENT AND PLAN: 1.  Acute congestive heart failure from the patient who is known to have history of systolic right heart failure. Will start him on intravenous Lasix. Will continue with his aspirin. The patient's metoprolol and Crestor was held by primary care physician; not known why at this  point, so we will hold on resuming until reason is known.  As well, we will want to keep him on losartan due to his hyperkalemia, and certainly no ACE inhibitor due to his allergy. We will consult cardiology and to continue to cycle his troponins. We will have him on intravenous Lasix, daily weights and will continue with aspirin.  We will consult cardiology, Dr. [name] who is familiar with the patient.  2.  Hyperkalemia. We will hold losartan and will hold potassium supplement.  The patient will be on Lasix. We will repeat level in a few hours after giving the patient p.o. Kayexalate calcium gluconate.  3.  Chronic kidney disease with a slightly worsening creatinine.  We will continue to monitor specifically as the patient is on intravenous Lasix.  4.  Urinary tract infection. We will continue with Rocephin.  5.  Hypertension, acceptable. We will continue with home medication.  6.  Chronic obstructive pulmonary disease. The patient does not appear to be having any wheezes, we will keep him on p.r.n. albuterol and Symbicort.  7.   Deep vein thrombosis prophylaxis. Subcutaneous heparin.  8.  CODE STATUS discussed with the wife at bedside who reports the patient is a DO NOT RESUSCITATE, currently has been followed  by hospice at home. We will consult social work consult to evaluate her for her home care.   Total time spent on admission and patient care: 60 minutes.     ____________________________ Starleen Armsawood S. Elgergawy, MD dse:ct D: 10/02/2012 08:35:34 ET T: 10/02/2012 08:59:17 ET JOB#: [id number]  cc: Starleen Armsawood S. Elgergawy, MD, <Dictator> Karie Schwalbeichard I. Letvak, MD DAWOOD Teena IraniS ELGERGAWY MD ELECTRONICALLY SIGNED 10/05/2012 0:20

## 2014-10-01 NOTE — Consult Note (Signed)
General Aspect 81 -year-old gentleman with history of coronary artery disease, angioplasty of proximal RCA lesion estimated at 90% in 1994, previous episodes of tachy palpitations and mild shortness of breath ,  previous admission to [facility] for chest pain after arguing with his wife, with negative stress test (lexiscan Myoview),  H/o  severe COPD,  remote smoking history, moderate to severe pulmonary hypertension 08/2011,  presents for worsening SOB, leg edema, weakness.  his wife presents with him today. He is a poor historian secondary to recent memory issues. She states she has had weight loss, tremendous muscle atrophy in his upper extremities and chest, poor by mouth intake, worsening shortness of breath and edema. She is unable to handle him at home. What led to this admission was her inability to get him off the toilet, she try to help him up and they both fell on the ground. She is interested in long-term placement at [facility].   He states his weight has been going up, he has only been taking Lasix 3 times per week. He drinks a significant amount of water daily. currently using 5 L oxygen on a chronic basis.  He continues to be bothered by memory/recall loss of weight, possible depression, feeling frustrated about his inactivity and memory. He has lost the ability to taste food. He has had followup with neurology in the past.   Previous echocardiogram 08/2011 showed at least moderate to severe pulmonary hypertension with right ventricular systolic pressures of >22, normal ejection fraction EF 55%, dilated inferior vena cava (study was done by the outside facility and images are not available) cholesterol has been at goal   Present Illness . SOCIAL HISTORY: Currently the patient lives at home with his wife. He is on hospice care.  History of remote tobacco abuse. No alcohol or illicit drug use.   FAMILY HISTORY: Significant for diabetes and gout.   Physical Exam:  GEN well developed, thin    HEENT hearing intact to voice, moist oral mucosa   NECK supple   RESP normal resp effort  postive use of accessory muscles  rhonchi  crackles  on nasal cannula oxygen   CARD Regular rate and rhythm  Murmur   Murmur Systolic   Systolic Murmur left lower sternal border   ABD denies tenderness  soft   LYMPH negative neck   EXTR positive edema, 2+ pitting edema of his lower extremities   SKIN normal to palpation   NEURO motor/sensory function intact   PSYCH alert, confused   Review of Systems:  Subjective/Chief Complaint poor historian Increasing shortness of breath and edema   General: Fatigue  Weakness   Skin: No Complaints   ENT: No Complaints   Eyes: No Complaints   Neck: No Complaints   Respiratory: Frequent cough  Short of breath   Cardiovascular: Dyspnea   Gastrointestinal: No Complaints   Genitourinary: No Complaints   Vascular: No Complaints   Musculoskeletal: leg weakness   Neurologic: No Complaints   Hematologic: No Complaints   Endocrine: No Complaints   Psychiatric: No Complaints   Review of Systems: All other systems were reviewed and found to be negative   Medications/Allergies Reviewed Medications/Allergies reviewed     Dementia:    Brochitis:    High Cholesterol:    COPD:    HTN:    Sleep Apnea:    Denies surgical history.:   Home Medications: Medication Instructions Status  aspirin 81 mg oral tablet 1 tab(s) orally once a day Active  furosemide 20 mg oral tablet 1 tab(s) orally 3 times a week, As Needed Active  losartan 50 mg oral tablet 1 tab(s) orally once a day Active  potassium chloride 10 mEq oral tablet, extended release 1 tab(s) orally 3 times a week, As Needed Active  Symbicort 80 mcg-4.5 mcg/inh inhalation aerosol 2 puff(s) inhaled 2 times a day Active  Ventolin HFA 90 mcg/inh inhalation aerosol 2 puff(s) inhaled every 6 hours, As Needed - for Wheezing Active  diltiazem 30 mg oral tablet 1 tab(s) orally 3  times a day, As Needed for palpitations Active  LORazepam 0.5 mg oral tablet 1 tab(s) orally once a day (at bedtime), As Needed - for Anxiety -for Shortness of Breath Active   Lab Results:  Routine Chem:  24-Apr-14 04:28   Potassium, Serum  5.7  B-Type Natriuretic Peptide [facility])  [phone] (Result(s) reported on 02 Oct 2012 at 06:16AM.)  Result Comment TOTAL CK - Slight hemolysis, interpret results with  - caution.  Result(s) reported on 02 Oct 2012 at 06:06AM.  Glucose, Serum  104  BUN  34  Creatinine (comp)  2.03  Sodium, Serum 138  Chloride, Serum 107  CO2, Serum 22  Calcium (Total), Serum 8.9  Anion Gap 9  Osmolality (calc) 284  eGFR (African American)  32  eGFR (Non-African American)  28 (eGFR values <75m/min/1.73 m2 may be an indication of chronic kidney disease (CKD). Calculated eGFR is useful in patients with stable renal function. The eGFR calculation will not be reliable in acutely ill patients when serum creatinine is changing rapidly. It is not useful in  patients on dialysis. The eGFR calculation may not be applicable to patients at the low and high extremes of body sizes, pregnant women, and vegetarians.)    13:19   Potassium, Serum 4.1 (Result(s) reported on 02 Oct 2012 at 01:49PM.)  Cardiac:  24-Apr-14 04:28   Troponin I < 0.02 (0.00-0.05 0.05 ng/mL or less: NEGATIVE  Repeat testing in 3-6 hrs  if clinically indicated. >0.05 ng/mL: POTENTIAL  MYOCARDIAL INJURY. Repeat  testing in 3-6 hrs if  clinically indicated. NOTE: An increase or decrease  of 30% or more on serial  testing suggests a  clinically important change)  CK, Total 73  CPK-MB, Serum  < 0.5    13:19   Troponin I < 0.02 (0.00-0.05 0.05 ng/mL or less: NEGATIVE  Repeat testing in 3-6 hrs  if clinically indicated. >0.05 ng/mL: POTENTIAL  MYOCARDIAL INJURY. Repeat  testing in 3-6 hrs if  clinically indicated. NOTE: An increase or decrease  of 30% or more on serial  testing suggests a   clinically important change)  CK, Total  31  CPK-MB, Serum 0.5 (Result(s) reported on 02 Oct 2012 at 01:54PM.)  Routine UA:  24-Apr-14 04:28   Color (UA) Amber  Clarity (UA) Cloudy  Glucose (UA) Negative  Bilirubin (UA) Negative  Ketones (UA) Negative  Specific Gravity (UA) 1.021  Blood (UA) Negative  pH (UA) 5.0  Protein (UA) 100 mg/dL  Nitrite (UA) Negative  Leukocyte Esterase (UA) Trace (Result(s) reported on 02 Oct 2012 at 05:11AM.)  RBC (UA) 2 /HPF  WBC (UA) 6 /HPF  Bacteria (UA) TRACE  Epithelial Cells (UA) <1 /HPF  Mucous (UA) PRESENT  Hyaline Cast (UA) 22 /LPF (Result(s) reported on 02 Oct 2012 at 05:11AM.)  Routine Hem:  24-Apr-14 04:28   WBC (CBC) 7.4  RBC (CBC) 4.67  Hemoglobin (CBC) 14.6  Hematocrit (CBC) 45.3  Platelet Count (CBC)  135 (Result(s)  reported on 02 Oct 2012 at 04:50AM.)  MCV 97  MCH 31.3  MCHC 32.2  RDW  16.6   EKG:  Interpretation EKG shows normal sinus rhythm no significant ST or T wave changes   Radiology Results: XRay:    24-Apr-14 04:37, Chest Portable Single View  Chest Portable Single View   REASON FOR EXAM:    SHOB  COMMENTS:       PROCEDURE: DXR - DXR PORTABLE CHEST SINGLE VIEW  - Oct 02 2012  4:37AM     RESULT: Comparison is made to the study of July 19, 2012.    The lungs are adequately inflated. The interstitial markings are coarse   especially on the right and on the left in the infrahilar regions. The   cardiac silhouette is mildly enlarged. The pulmonary vascularity is not   clearly engorged. There is tortuosity of the descending thoracic aorta.    IMPRESSION:  There is noevidence of focal pneumonia. There are coarse   interstitial markings which are likely chronic. I cannot exclude acute   bronchitis and/or low-grade compensated CHF in the appropriate clinical     setting.     Dictation Site: 2        Verified By: DAVID A. Martinique, M.D., MD  Cardiology:    24-Apr-14 10:20, Echo Doppler  Echo Doppler    REASON FOR EXAM:      COMMENTS:       PROCEDURE: [place] - ECHO DOPPLER COMPLETE(TRANSTHOR)  - Oct 02 2012 10:20AM     RESULT: Echocardiogram Report    Patient Name:   Bradley Holt Date of Exam: 10/02/2012  Medical Rec #:  [id number]            Custom1:  Date of Birth:  08-Oct-1919          Height:       68.0 in  Patient Age:    16 years          Weight:       160.0 lb  Patient Gender: M                 BSA:          1.86 m??    Indications: CHF  Sonographer:    JERRY HEGE RDCS  Referring Phys: Marjean Donna, N    Sonographer Comments: Echo performed in the ED.    Summary:   1. Left ventricular ejection fraction, by visual estimation, is 35 to   40%.   2. Depressed EF and LV wall motion consistent with septal bowing from   elevated RVSP.   3. Impaired relaxation pattern of LV diastolic filling.   4. Mild to moderate left ventricular hypertrophy.   5. Mild to moderately depressed RV systolic function.   6. Mild to moderate tricuspid regurgitation.   7. Severely elevated pulmonary artery systolicpressure.  2D AND M-MODE MEASUREMENTS (normal ranges within parentheses):  Left Ventricle:          Normal  IVSd (2D):      1.67 cm (0.7-1.1)  LVPWd (2D):     1.21 cm (0.7-1.1) Aorta/LA:                  Normal  LVIDd (2D):     3.95 cm (3.4-5.7) Aortic Root (2D): 3.00 cm (2.4-3.7)  LVIDs (2D):     2.87 cm           Left Atrium (2D): 2.60 cm (1.9-4.0)  LV FS (2D):  27.3 %   (>25%)  LV EF (2D):     53.8 %   (>50%)                                    Right Ventricle:      RVd (2D):        4.09 cm  LV DIASTOLIC FUNCTION:  MV Peak E: 0.36 m/s E/e' Ratio: 10.90  MV Peak A: 0.75 m/s Decel Time: 261 msec  E/A Ratio: 0.48  SPECTRAL DOPPLER ANALYSIS (where applicable):  Mitral Valve:  MV P1/2 Time: 75.69 msec  MV Area, PHT: 2.91 cm??  Aortic Valve: AoV Max Vel: 0.92 m/s AoV Peak PG: 3.4 mmHg AoV Mean PG:  LVOT Vmax: 0.58 m/s LVOT VTI:  LVOT Diameter: 2.10 cm  AoV Area, Vmax: 2.18 cm?? AoV  Area, VTI:  AoV Area, Vmn:  Tricuspid Valve and PA/RV Systolic Pressure: TR Max Velocity: 4.32 m/s RA   Pressure: 10 mmHg RVSP/PASP: 84.8 mmHg  Pulmonic Valve:  PV Max Velocity: 0.60 m/s PV Max PG: 1.5 mmHg PV Mean PG:  Pulmonic Insufficiency:  PI EDV: 1.67 m/s PADP: 21.2 mmHg    PHYSICIAN INTERPRETATION:  Left Ventricle: The leftventricular internal cavity size was normal. LV   posterior wall thickness was normal. Moderate left ventricular     hypertrophy. Left ventricular ejection fraction, by visual estimation, is   35 to 40%. Spectral Doppler shows impaired relaxation pattern of LV   diastolic filling.  Right Ventricle: The right ventricular size is moderately enlarged.   Global RV systolic function is mildly reduced.  Right Atrium: The right atrium is mildly dilated.  Tricuspid Valve: The tricuspid valve is normal. Mild to moderate   tricuspid regurgitation is visualized. The tricuspid regurgitant velocity   is 4.32 m/s, and with an assumed right atrial pressure of 10 mmHg, the   estimated right ventricular systolic pressure is severely elevated at   84.8 mmHg.  Aortic Valve: Mild aortic stenosis is present. Mild aortic valve   regurgitation is seen.  Pulmonic Valve: Mild pulmonic valve regurgitation.  [id number] Ida Rogue MD  Electronically signed by [id number] Ida Rogue MD  Signature Date/Time: 10/02/2012/5:59:52 PM    *** Final ***    IMPRESSION: .        Verified By: Minna Merritts, M.D., MD    Ace Inhibitors: Unknown  Vital Signs/Nurse's Notes: **Vital Signs.:   24-Apr-14 11:43  Vital Signs Type Admission  Celsius 36.3  Temperature Source oral  Pulse Pulse 92  Respirations Respirations 18  Systolic BP Systolic BP 562  Diastolic BP (mmHg) Diastolic BP (mmHg) 91  Mean BP 114  Pulse Ox % Pulse Ox % 90  Pulse Ox Activity Level  At rest  Oxygen Delivery 5L    Impression 27 -year-old gentleman with history of coronary artery disease, angioplasty of  proximal RCA lesion estimated at 90% in 1994, previous episodes of tachy palpitations and mild shortness of breath ,  previous admission to [facility] for chest pain after arguing with his wife, with negative stress test (lexiscan Myoview),  H/o  severe COPD,  remote smoking history, moderate to severe pulmonary hypertension 08/2011,  presents for worsening SOB, leg edema, weakness.   Plan Pulmonary hypertension - Worsening SOB, edema, elevated BNP consistent with worsening fluid status,  --Agree with lasix IV BID, close monitoring of renal function.  echocardiogram showing right ventricular systolic pressures are severe  CORONARY ARTERY DISEASE, S/P PTCA -     Currently with no symptoms of angina. No further workup at this time. Continue current medication regimen.        Palpitations      APCs seen in the past on EKG. He can take beta blocker as needed, also diltiazem as needed for symptomatic palpitations.       Alzheimer disease -  Was doing well for a while last year when his wife was in teh hospital.    His activity  increased,   doing more around the house,  Not doing as well recently. Depression. Worsening shortness of breath, weight gain, worsening diastolic heart failure and pulmonary hypertension  He will need placement in skilled nursing   Electronic Signatures: Ida Rogue (MD)  (Signed 24-Apr-14 18:11)  Authored: General Aspect/Present Illness, History and Physical Exam, Review of System, Past Medical History, Home Medications, Labs, EKG , Radiology, Allergies, Vital Signs/Nurse's Notes, Impression/Plan   Last Updated: 24-Apr-14 18:11 by Ida Rogue (MD)

## 2014-10-01 NOTE — Discharge Summary (Signed)
PATIENT NAME:  Bradley Holt, Bradley Holt MR#:  [id number] DATE OF BIRTH:  1919/07/14  DATE OF ADMISSION:  10/02/2012  DATE OF DISCHARGE:   10/06/2012  PRIMARY CARE PHYSICIAN:  Dr. Alphonsus Sias.   PRIMARY CARDIOLOGIST:  Dr. [name].   DISCHARGE DIAGNOSES: 1.  Acute on chronic systolic congestive heart failure.  2.  Acute on chronic respiratory failure.  3.  Severe pulmonary hypertension.  4.  Hypokalemia.  5.  Acute renal failure over chronic kidney disease.  6.  Alzheimer's dementia.  7.  Malnutrition.  8.  Mild thrombocytopenia.   CONSULTS:  Dr. [name] of cardiology.   IMAGING STUDIES DONE: Include a chest x-ray, which showed congestive heart failure, pulmonary edema.   ADMITTING HISTORY AND PHYSICAL:  Please see detailed H and P dictated by Dr. Randol Kern on 10/02/2012. In brief, a 81 year old male patient with history of chronic respiratory failure on 5 liters of oxygen, COPD, pulmonary hypertension on hospice, admitted to the hospital with worsening shortness of breath, fatigue. The patient had orthopnea, lower extremity edema, was admitted for worsening shortness of breath secondary to CHF.   HOSPITAL COURSE: 1.  Acute on chronic systolic CHF. The patient had significant contribution from COPD and pulmonary hypertension. Decompensated CHF. Dr. [name] of cardiology saw the patient. Initially, the patient was started on IV Lasix, but later had to be tapered back to p.o., as his creatinine worsened. On the day of discharge, the patient is back to baseline with his breathing, still on 5 liters of oxygen, likely this at home. Diuresed well, and is being discharged back home. I have discussed with the son at bedside regarding the patient's progressive worsening and continuing the hospice and poor prognosis in this case with multiple comorbidities of COPD, severe pulmonary hypertension and systolic CHF, and the son verbalized understanding. We will continue patient's Lasix orally. The patient will follow up  as outpatient with Dr. [name].  2.  Hyperkalemia resolved with diuresis.  3.  Alzheimer's dementia, stable.   On the day of discharge, the patient still has some basal crackles on 5 liters oxygen, saturating 94%.   DISCHARGE DISPOSITION:  Discharge home with guarded prognosis.   DISCHARGE MEDICATIONS:  Include: 1.  Aspirin 81 mg oral once a day.  2.  Losartan 50 mg oral once a day.  3.  Symbicort 84.5, 2 puffs inhaled 2 times a day.  4.  Ventolin HFA 90 mcg 2 puffs inhaled every 6 hours as needed for wheezing.  5.  Diltiazem 30 mg oral 3 times a day as needed for palpitations.  6.  Lorazepam 0.5 mg oral once a day as needed for anxiety.  7.  Lasix 20 mg oral 3 times a day.  8.  Potassium chloride 10 mEq oral 3 times a week.  9.  Glucerna 237 mL oral 2 times a day.  10.  Acetaminophen 650 mg oral every 4 hours as needed for pain or temperature.  11.  Beta blocker not prescribed secondary to bradycardia in the past.   DISCHARGE INSTRUCTIONS:  The patient will be on 5 liters oxygen through nasal cannula. Low-sodium diet. Activity as tolerated with supervision. Follow up with primary care physician and Dr. [name] in 1 to 2 weeks. The patient will be followed by hospice team. The patient has been discharged to skilled nursing facility for higher level of care while the patient was at independent living in the past.   Time spent on day of discharge in discharge activity was 46 minutes.  ____________________________ Molinda BailiffSrikar R. Avri Paiva, MD srs:dmm D: 10/06/2012 13:02:00 ET T: 10/06/2012 13:10:20 ET JOB#: [id number]  cc: Wardell HeathSrikar R. Elpidio AnisSudini, MD, <Dictator> Karie Schwalbeichard I. Letvak, MD Antonieta Ibaimothy J. Gollan, MD Orie FishermanSRIKAR R Maeryn Mcgath MD ELECTRONICALLY SIGNED 10/22/2012 13:52

## 2014-10-03 NOTE — Consult Note (Signed)
    Comments   Follow up visit made. Wife now present. Once again discussed code status with wife and pt. Pt even more certain now than he was earlier that he wants to be a DNR. Wife in agreement. Order entered and out-of-facility DNR completed and placed in chart.  discussed home hospice but wife feels that husband does not need this at this time. I offered [facility] and wife was very interested in this. I will make referral in AM.   Electronic Signatures: Kayron Kalmar, Harriett SineNancy (MD)  (Signed 10-Jul-13 17:14)  Authored: Palliative Care   Last Updated: 10-Jul-13 17:14 by Saachi Zale, Harriett SineNancy (MD)

## 2014-10-03 NOTE — Discharge Summary (Signed)
PATIENT NAME:  Bradley Holt, Bradley Holt MR#:  [id number] DATE OF BIRTH:  [birth year]  DATE OF ADMISSION:  12/18/2011 DATE OF DISCHARGE:  12/20/2011  FINAL DIAGNOSES:  1. Acute on chronic respiratory failure.  2. Chronic obstructive pulmonary disease with acute exacerbation.  3. Dehydration.  4. Chronic right-sided systolic congestive heart failure with pulmonary hypertension related to his chronic obstructive pulmonary disease.  5. Coronary artery disease.  6. History of anemia.  7. Chronic kidney disease, stage III.  8. Hyperlipidemia.  9. Benign prostatic hypertrophy.  10. Sleep apnea.  11. History of pulmonary fibrosis.  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   HISTORY AND PHYSICAL: Please see dictated admission history and physical.   SUMMARY OF HOSPITAL COURSE: The patient was admitted with increasing fatigue, evidence of dehydration as well as worsening respiratory status, with saturation into the low 80s on 4 liters nasal cannula. He had previously been evaluated by pulmonology and recently evaluated by cardiology and had been placed on Lasix secondary to his pulmonary hypertension and it was felt that he may have gotten a bit dehydrated on this. He has severe respiratory disease at baseline, and there is some challenge in getting the patient and family members to understand the severity of his disease. Palliative care saw the patient, as did pulmonology. He was weaned off medications for chronic obstructive pulmonary disease exacerbation and continued on inhaled medicines. He received IV fluids with significant improvement. Physical therapy saw the patient and he did ambulate fairly well with this, but felt general fatigue. He continued to drop his oxygen saturations into the mid 80s on 4 liters nasal cannula and it was felt this is secondary to his severe degree of pulmonary fibrosis. Family members felt that he might need to go somewhere other than home, and so at this time will be going to  skilled nursing respite care. He should be weighed daily, with physician called for more than 2 pound gain in one day or 5 pounds in one week or increasing signs or symptoms of heart failure. His diet should be 2 gram sodium or no added salt, whichever the facility can provide. His physical activity should be up as tolerated with a walker. Physical therapy will evaluate and treat the patient. We will anticipate him returning to see us in 1 to 2 weeks. He should be on nasal cannula 4 liters at rest, increase this to 6 liters with exertion. He should use his CPAP machine at the same settings whenever he is sleeping.   DISCHARGE MEDICATIONS:  1. Aspirin 81 mg p.o. daily.  2. Symbicort 80/4.5 mcg, 1 puff twice a day. 3. Ventolin metered-dose inhaler 2 puffs three times daily. 4. Spiriva 1 capsule inhaled daily.  5. Zyrtec 10 mg p.o. daily.  6. Aricept 10 mg p.o. at bedtime.  7. Lopressor 25 mg p.o. twice a day. 8. Crestor 20 mg p.o. at bedtime.  9. Revatio 20 mg p.o. daily.  10. Lasix 20 mg p.o. daily as needed for edema or weight gain.  11. Potassium 10 milliequivalents p.o. daily as needed with Lasix.  12. Losartan 25 mg p.o. daily, dose has been reduced.   CODE STATUS: During this patient's hospitalization, he was NO CODE BLUE.  ____________________________ Lynnea FerrierBert J. Klein III, MD bjk:slb D: 12/20/2011 14:25:34 ET T: 12/20/2011 14:41:59 ET JOB#: [id number]  cc: Lynnea FerrierBert J. Klein III, MD, <Dictator> Daniel NonesBERT KLEIN MD ELECTRONICALLY SIGNED 12/20/2011 17:38

## 2014-10-03 NOTE — Consult Note (Signed)
    Comments   I spoke with pt's wife by phone to try to clarify issues that pt is unable to explain due to memory loss. Wife agrees that pt has had a significant decline over the past months, esp with his memory loss. I talked with her about home hospice. She at first stated that she didn't think pt "was that bad off yet" but then agreed that I could talk to pt about it. I also talked with her about code status. She says that pt has a living will but she doesn't know what it says or where it is. She suggested I speak with pt about this as well. spoke with pt at length. He says that he does have a living will but does not know what it says. I talked with him about intubation and he stated "Why would I want to do that?" He said that he hoped he would just "go in my sleep some night" and that "everybody has to curl up their toes one day and leave a vale of tears behind". I then spoke with pt about hospice. He stated that he doesn't understand why everyone thinks he needs help but eventually agreed that they could try home hospice. I am going to return this afternoon when wife is present and tell her of my conversation with pt. If he/she are in agreement, will make pt DNR and order hospice screen.   Electronic Signatures: Kymberlyn Eckford, Harriett SineNancy (MD)  (Signed 10-Jul-13 13:09)  Authored: Palliative Care   Last Updated: 10-Jul-13 13:09 by Goro Wenrick, Harriett SineNancy (MD)

## 2014-10-03 NOTE — H&P (Signed)
PATIENT NAME:  Bradley Holt, Bradley Holt MR#:  [id number] DATE OF BIRTH:  [birth year]  DATE OF ADMISSION:  12/18/2011  CHIEF COMPLAINT: Shortness of breath, fatigue.   HISTORY OF PRESENT ILLNESS: This is an 81 year old male with history of chronic respiratory failure, with baseline oxygen level at 3 to 4 liters, with history of pulmonary fibrosis and pulmonary hypertension. He has been evaluated by Dr. Welton Flakes and he is followed by Dr. [name] for history of coronary artery disease. Over the last several months he has had worsening generalized course with fatigue, dyspnea, and memory loss. He has been evaluated by neurology, who feels that this is unlikely to be Alzheimer's, and we have had difficulty at times getting him to put his oxygen on, and his saturation will frequently fall down into the 70s and 80s once he takes his oxygen off. He has been followed closely by Dr. Welton Flakes and Dr. [name] more recently and was started on Lasix every other day about six days ago for pulmonary hypertension by report. He comes in with generalized fatigue, 6 pound weight loss, no energy, and not eating. He has also developed some diarrhea, which is more frequent stool. He has had no abdominal pain, fevers, or chills. He has some cough, chronically. On examination in the office, his saturation was down to 81% on 3.5 liters, with poor air flow and what appears to be poor skin turgor. He is admitted now with acute on chronic respiratory failure, may have some element of dehydration with combination of Lasix and recent dehydration and recent diarrhea, cannot exclude C. difficile, but this seems less likely. He also has a significant element of adult failure to thrive, and he and his wife appear not to be particularly accepting of his expected clinical course despite multiple discussions with multiple physicians.   PAST MEDICAL HISTORY:  1. Hypertension. 2. Chronic obstructive pulmonary disease with chronic respiratory failure. 3. Coronary  artery disease with previous angioplasty.  4. Anemia.  5. Chronic kidney disease, stage III.  6. Hyperlipidemia.  7. Benign prostatic hypertrophy.  8. Sleep apnea.  9. Pulmonary fibrosis with pulmonary hypertension.  10. 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   ALLERGIES: ACE inhibitors cause cough.   MEDICATIONS:  1. Aspirin 81 mg p.o. daily.  2. Lopressor 25 mg p.o. twice a day. 3. Zyrtec 10 mg p.o. daily. 4. Ventolin HFA metered-dose inhaler 2 puffs every six hours.  5. Losartan 50 mg p.o. at bedtime.  6. Lopressor 20 mg p.o. at bedtime. 7. Cardizem 30 mg p.o. three times daily p.r.n. palpitations.  8. Aricept 10 mg p.o. at bedtime.  9. Symbicort 80/4.5 mcg two puffs twice a day.  10. Sildenafil 20 mg p.o. three times daily.  SOCIAL HISTORY: Remote tobacco use. No alcohol.   FAMILY HISTORY: Diabetes and gout.  REVIEW OF SYSTEMS: Please see history of present illness. Feels chilled but no true chills. No vision changes. No dysphagia. No urinary changes. Remainder of complete review of systems is negative.   PHYSICAL EXAMINATION:   VITAL SIGNS: Weight 166, blood pressure 140/50, temperature 98.0, pulse 73, respirations 18, and saturation 81% on 3.5 liters.   GENERAL: Elderly male, appears fatigued.   EYES: Pupils round and reactive to light. Lids and conjunctiva unremarkable.   EARS, NOSE, AND THROAT: External examination unremarkable. Oropharynx is dry but without lesions.   NECK: Supple. Trachea midline. No thyromegaly.   HEART: Regular rate and rhythm with occasional ectopy. Carotid and radial pulses 2+.   LUNGS: Poor air  flow throughout. Basilar crackles. No retractions.   ABDOMEN: Soft, nontender, and nondistended. Positive bowel sounds. No guard, rebound, or Murphy's.   SKIN: Decreased turgor without rashes.   LYMPH NODES: No cervical or supraclavicular nodes.   MUSCULOSKELETAL: No clubbing or cyanosis.   NEUROLOGIC: Hearing decreased. Motor strength  appears grossly symmetrical with gait not tested. Poor short-term memory.   IMPRESSION AND PLAN:  1. Acute on chronic respiratory failure. Place on steroids, change to SVNs and continue Symbicort. Chest x-ray. Pulmonary consultation. Hold Sildenafil for now. Again unclear how much of this is acute and how much of this is chronic change.  2. Fatigue, multifactorial, may have element of dehydration. Hold Lasix for now. Send C. difficile as he has frequent stool. Low rate IV fluids for now, and await electrolytes.  3. Memory loss. This has been evaluated and thought to be secondary to vascular dementia as well as hypoxia. Continue Aricept.  4. Coronary artery disease. Continue aspirin and beta blocker. Follow cardiac enzymes.  5. Chronic kidney disease. Following renal function.  6. Sleep apnea. May use home CPAP.  7. Steroid-induced diabetes. Cover with sliding scale insulin.  8. Discharge plan. We will ask palliative care to see the patient as he really has not been doing well at home to begin with. If we cannot find an acute problem to repair, I suspect this is more progression of his chronic difficulties, and he and his wife really do need to come to grips with his unfortunate medical condition. Hopefully palliative care can help us with this.  ____________________________ Lynnea FerrierBert J. Benedetto Ryder III, MD [id number] D: 12/18/2011 16:12:55 ET    T: 12/18/2011 16:38:25 ET       JOB#: [id number] cc: Lynnea FerrierBert J. Cathye Kreiter III, MD, <Dictator> Daniel NonesBERT Yacine Droz MD ELECTRONICALLY SIGNED 12/20/2011 17:37
